# Patient Record
Sex: Male | Born: 1960 | Race: Black or African American | Hispanic: No | Marital: Single | Smoking: Current every day smoker
Health system: Southern US, Community
[De-identification: ages and names within clinical notes are randomized; demographics above are authoritative.]

## PROBLEM LIST (undated history)

## (undated) DIAGNOSIS — K6389 Other specified diseases of intestine: Secondary | ICD-10-CM

## (undated) DIAGNOSIS — A159 Respiratory tuberculosis unspecified: Secondary | ICD-10-CM

## (undated) DIAGNOSIS — I10 Essential (primary) hypertension: Secondary | ICD-10-CM

---

## 2002-06-20 ENCOUNTER — Emergency Department (HOSPITAL_COMMUNITY): Admission: EM | Admit: 2002-06-20 | Discharge: 2002-06-21 | Payer: Self-pay | Admitting: Emergency Medicine

## 2007-07-22 ENCOUNTER — Emergency Department (HOSPITAL_COMMUNITY): Admission: EM | Admit: 2007-07-22 | Discharge: 2007-07-22 | Payer: Self-pay | Admitting: Emergency Medicine

## 2008-09-28 ENCOUNTER — Emergency Department (HOSPITAL_COMMUNITY): Admission: EM | Admit: 2008-09-28 | Discharge: 2008-09-28 | Payer: Self-pay | Admitting: Emergency Medicine

## 2019-07-13 ENCOUNTER — Encounter (HOSPITAL_BASED_OUTPATIENT_CLINIC_OR_DEPARTMENT_OTHER): Payer: Self-pay

## 2019-07-13 ENCOUNTER — Other Ambulatory Visit: Payer: Self-pay

## 2019-07-13 ENCOUNTER — Inpatient Hospital Stay (HOSPITAL_BASED_OUTPATIENT_CLINIC_OR_DEPARTMENT_OTHER): Payer: BC Managed Care – PPO

## 2019-07-13 ENCOUNTER — Emergency Department (HOSPITAL_BASED_OUTPATIENT_CLINIC_OR_DEPARTMENT_OTHER): Payer: BC Managed Care – PPO

## 2019-07-13 ENCOUNTER — Inpatient Hospital Stay (HOSPITAL_BASED_OUTPATIENT_CLINIC_OR_DEPARTMENT_OTHER)
Admission: EM | Admit: 2019-07-13 | Discharge: 2019-07-23 | DRG: 330 | Disposition: A | Discharge status: Home / Self Care | Payer: BC Managed Care – PPO | Attending: Internal Medicine | Admitting: Internal Medicine

## 2019-07-13 DIAGNOSIS — Z8611 Personal history of tuberculosis: Secondary | ICD-10-CM | POA: Diagnosis not present

## 2019-07-13 DIAGNOSIS — I10 Essential (primary) hypertension: Secondary | ICD-10-CM | POA: Diagnosis present

## 2019-07-13 DIAGNOSIS — K56609 Unspecified intestinal obstruction, unspecified as to partial versus complete obstruction: Secondary | ICD-10-CM

## 2019-07-13 DIAGNOSIS — Z20828 Contact with and (suspected) exposure to other viral communicable diseases: Secondary | ICD-10-CM | POA: Diagnosis present

## 2019-07-13 DIAGNOSIS — E876 Hypokalemia: Secondary | ICD-10-CM | POA: Diagnosis present

## 2019-07-13 DIAGNOSIS — R634 Abnormal weight loss: Secondary | ICD-10-CM | POA: Diagnosis present

## 2019-07-13 DIAGNOSIS — Z01810 Encounter for preprocedural cardiovascular examination: Secondary | ICD-10-CM

## 2019-07-13 DIAGNOSIS — C184 Malignant neoplasm of transverse colon: Secondary | ICD-10-CM | POA: Diagnosis present

## 2019-07-13 DIAGNOSIS — K6389 Other specified diseases of intestine: Secondary | ICD-10-CM

## 2019-07-13 DIAGNOSIS — Z716 Tobacco abuse counseling: Secondary | ICD-10-CM | POA: Diagnosis not present

## 2019-07-13 DIAGNOSIS — F17210 Nicotine dependence, cigarettes, uncomplicated: Secondary | ICD-10-CM | POA: Diagnosis present

## 2019-07-13 DIAGNOSIS — R17 Unspecified jaundice: Secondary | ICD-10-CM | POA: Diagnosis present

## 2019-07-13 DIAGNOSIS — Z8249 Family history of ischemic heart disease and other diseases of the circulatory system: Secondary | ICD-10-CM | POA: Diagnosis not present

## 2019-07-13 HISTORY — DX: Essential (primary) hypertension: I10

## 2019-07-13 HISTORY — DX: Other specified diseases of intestine: K63.89

## 2019-07-13 HISTORY — DX: Respiratory tuberculosis unspecified: A15.9

## 2019-07-13 LAB — COMPREHENSIVE METABOLIC PANEL
ALT: 24 U/L (ref 0–44)
AST: 27 U/L (ref 15–41)
Albumin: 4.6 g/dL (ref 3.5–5.0)
Alkaline Phosphatase: 83 U/L (ref 38–126)
Anion gap: 13 (ref 5–15)
BUN: 9 mg/dL (ref 6–20)
CO2: 24 mmol/L (ref 22–32)
Calcium: 9.6 mg/dL (ref 8.9–10.3)
Chloride: 101 mmol/L (ref 98–111)
Creatinine, Ser: 0.98 mg/dL (ref 0.61–1.24)
GFR calc Af Amer: >60 mL/min (ref >60)
GFR calc non Af Amer: >60 mL/min (ref >60)
Glucose, Bld: 111 mg/dL — ABNORMAL HIGH (ref 70–99)
Potassium: 3.3 mmol/L — ABNORMAL LOW (ref 3.5–5.1)
Sodium: 138 mmol/L (ref 135–145)
Total Bilirubin: 2.1 mg/dL — ABNORMAL HIGH (ref 0.3–1.2)
Total Protein: 8.4 g/dL — ABNORMAL HIGH (ref 6.5–8.1)

## 2019-07-13 LAB — URINALYSIS, MICROSCOPIC (REFLEX)

## 2019-07-13 LAB — CBC
HCT: 48.9 % (ref 39.0–52.0)
Hemoglobin: 16.5 g/dL (ref 13.0–17.0)
MCH: 31 pg (ref 26.0–34.0)
MCHC: 33.7 g/dL (ref 30.0–36.0)
MCV: 91.7 fL (ref 80.0–100.0)
Platelets: 252 10*3/uL (ref 150–400)
RBC: 5.33 MIL/uL (ref 4.22–5.81)
RDW: 14.3 % (ref 11.5–15.5)
WBC: 9.2 10*3/uL (ref 4.0–10.5)
nRBC: 0 % (ref 0.0–0.2)

## 2019-07-13 LAB — URINALYSIS, ROUTINE W REFLEX MICROSCOPIC
Glucose, UA: NEGATIVE mg/dL
Ketones, ur: 15 mg/dL — AB
Leukocytes,Ua: NEGATIVE
Nitrite: NEGATIVE
Protein, ur: 30 mg/dL — AB
Specific Gravity, Urine: >1.03 — ABNORMAL HIGH (ref 1.005–1.030)
pH: 6 (ref 5.0–8.0)

## 2019-07-13 LAB — MAGNESIUM: Magnesium: 2.1 mg/dL (ref 1.7–2.4)

## 2019-07-13 LAB — LIPASE, BLOOD: Lipase: 23 U/L (ref 11–51)

## 2019-07-13 LAB — PHOSPHORUS: Phosphorus: 3.5 mg/dL (ref 2.5–4.6)

## 2019-07-13 LAB — PROTIME-INR
INR: 1.1 (ref 0.8–1.2)
Prothrombin Time: 14.3 seconds (ref 11.4–15.2)

## 2019-07-13 LAB — SARS CORONAVIRUS 2 BY RT PCR (HOSPITAL ORDER, PERFORMED IN ~~LOC~~ HOSPITAL LAB): SARS Coronavirus 2: NEGATIVE

## 2019-07-13 MED ORDER — POTASSIUM CHLORIDE CRYS ER 20 MEQ PO TBCR
20.0000 meq | EXTENDED_RELEASE_TABLET | Freq: Once | ORAL | Status: AC
  Administered 2019-07-13: 20 meq via ORAL
  Filled 2019-07-13: qty 1

## 2019-07-13 MED ORDER — ACETAMINOPHEN 325 MG PO TABS
650.0000 mg | ORAL_TABLET | Freq: Four times a day (QID) | ORAL | Status: DC | PRN
  Administered 2019-07-19: 21:00:00 650 mg via ORAL
  Filled 2019-07-13 (×3): qty 2

## 2019-07-13 MED ORDER — SODIUM CHLORIDE 0.9% FLUSH
3.0000 mL | Freq: Once | INTRAVENOUS | Status: DC
  Filled 2019-07-13: qty 3

## 2019-07-13 MED ORDER — POTASSIUM CHLORIDE IN NACL 40-0.9 MEQ/L-% IV SOLN
INTRAVENOUS | Status: DC
  Administered 2019-07-13 – 2019-07-14 (×2): 100 mL/h via INTRAVENOUS
  Filled 2019-07-13 (×4): qty 1000

## 2019-07-13 MED ORDER — MORPHINE SULFATE (PF) 4 MG/ML IV SOLN
4.0000 mg | Freq: Once | INTRAVENOUS | Status: AC
  Administered 2019-07-13: 4 mg via INTRAVENOUS
  Filled 2019-07-13: qty 1

## 2019-07-13 MED ORDER — NICOTINE 21 MG/24HR TD PT24: 21.0000 mg | MEDICATED_PATCH | Freq: Every day | TRANSDERMAL | Status: DC | PRN

## 2019-07-13 MED ORDER — HYDROMORPHONE HCL 1 MG/ML IJ SOLN
1.0000 mg | Freq: Once | INTRAMUSCULAR | Status: AC
  Administered 2019-07-13: 1 mg via INTRAVENOUS
  Filled 2019-07-13 (×2): qty 1

## 2019-07-13 MED ORDER — IOHEXOL 300 MG/ML  SOLN
100.0000 mL | Freq: Once | INTRAMUSCULAR | Status: AC | PRN
  Administered 2019-07-13: 100 mL via INTRAVENOUS

## 2019-07-13 MED ORDER — PROCHLORPERAZINE EDISYLATE 10 MG/2ML IJ SOLN
5.0000 mg | INTRAMUSCULAR | Status: DC | PRN
  Administered 2019-07-13 – 2019-07-21 (×2): 5 mg via INTRAVENOUS
  Filled 2019-07-13 (×2): qty 2

## 2019-07-13 MED ORDER — SODIUM CHLORIDE 0.9 % IV BOLUS
1000.0000 mL | Freq: Once | INTRAVENOUS | Status: AC
  Administered 2019-07-13: 1000 mL via INTRAVENOUS

## 2019-07-13 MED ORDER — ACETAMINOPHEN 650 MG RE SUPP: 650.0000 mg | Freq: Four times a day (QID) | RECTAL | Status: DC | PRN

## 2019-07-13 NOTE — ED Provider Notes (Signed)
[facility] EMERGENCY DEPARTMENT Provider Note   CSN: [id number] Arrival date & time: 07/13/19  1600     History   Chief Complaint Chief Complaint  Patient presents with  . Abdominal Pain    HPI Billy Yates is a 58 y.o. male.  He is presenting with 2 days of diffuse abdominal pain.  He is also been more constipated over a week although he is tried a laxative and had a little bit of a bowel movement today.  He is nauseous and is vomited once.  No fevers but he is feeling chilled at times.  No cough no shortness of breath no sore throat no headache.  No urinary symptoms.     The history is provided by the patient.  Abdominal Pain Pain location:  Generalized Pain quality: aching   Pain radiates to:  Does not radiate Pain severity:  Moderate Onset quality:  Gradual Duration:  2 days Timing:  Constant Progression:  Worsening Chronicity:  New Context: laxative use   Context: not recent travel, not sick contacts, not suspicious food intake and not trauma   Relieved by:  Nothing Worsened by:  Nothing Ineffective treatments:  None tried Associated symptoms: chills, constipation, nausea and vomiting   Associated symptoms: no chest pain, no cough, no diarrhea, no dysuria, no fever, no hematemesis, no hematochezia, no hematuria, no melena, no shortness of breath and no sore throat     Past Medical History:  Diagnosis Date  . Tuberculosis     There are no active problems to display for this patient.   Past Surgical History:  Procedure Laterality Date  . ACHILLES TENDON REPAIR          Home Medications    Prior to Admission medications   Not on File    Family History No family history on file.  Social History Social History   Tobacco Use  . Smoking status: Current Every Day Smoker    Types: Cigarettes  . Smokeless tobacco: Never Used  Substance Use Topics  . Alcohol use: Never    Frequency: Never  . Drug use: Never     Allergies    Patient has no known allergies.   Review of Systems Review of Systems  Constitutional: Positive for chills. Negative for fever.  HENT: Negative for sore throat.   Eyes: Negative for visual disturbance.  Respiratory: Negative for cough and shortness of breath.   Cardiovascular: Negative for chest pain.  Gastrointestinal: Positive for abdominal distention, abdominal pain, constipation, nausea and vomiting. Negative for blood in stool, diarrhea, hematemesis, hematochezia and melena.  Genitourinary: Negative for dysuria and hematuria.  Musculoskeletal: Negative for neck pain.  Skin: Negative for rash.  Neurological: Negative for headaches.     Physical Exam Updated Vital Signs BP (!) 167/107 (BP Location: Left Arm)   Pulse 77   Temp 98.2 F (36.8 C) (Oral)   Resp 20   Ht 6' (1.829 m)   Wt 78.5 kg   SpO2 100%   BMI 23.46 kg/m   Physical Exam Vitals signs and nursing note reviewed.  Constitutional:      Appearance: He is well-developed.  HENT:     Head: Normocephalic and atraumatic.  Eyes:     Conjunctiva/sclera: Conjunctivae normal.  Neck:     Musculoskeletal: Neck supple.  Cardiovascular:     Rate and Rhythm: Normal rate and regular rhythm.     Heart sounds: No murmur.  Pulmonary:     Effort: Pulmonary effort is  normal. No respiratory distress.     Breath sounds: Normal breath sounds.  Abdominal:     General: Abdomen is flat. Bowel sounds are normal.     Palpations: Abdomen is soft.     Tenderness: There is no abdominal tenderness. There is no guarding or rebound.  Musculoskeletal: Normal range of motion.     Right lower leg: No edema.     Left lower leg: No edema.  Skin:    General: Skin is warm and dry.     Capillary Refill: Capillary refill takes less than 2 seconds.  Neurological:     General: No focal deficit present.     Mental Status: He is alert and oriented to person, place, and time.     Gait: Gait normal.      ED Treatments / Results  Labs  (all labs ordered are listed, but only abnormal results are displayed) Labs Reviewed  COMPREHENSIVE METABOLIC PANEL - Abnormal; Notable for the following components:      Result Value   Potassium 3.3 (*)    Glucose, Bld 111 (*)    Total Protein 8.4 (*)    Total Bilirubin 2.1 (*)    All other components within normal limits  URINALYSIS, ROUTINE W REFLEX MICROSCOPIC - Abnormal; Notable for the following components:   Color, Urine AMBER (*)    Specific Gravity, Urine >1.030 (*)    Hgb urine dipstick MODERATE (*)    Bilirubin Urine MODERATE (*)    Ketones, ur 15 (*)    Protein, ur 30 (*)    All other components within normal limits  URINALYSIS, MICROSCOPIC (REFLEX) - Abnormal; Notable for the following components:   Bacteria, UA RARE (*)    All other components within normal limits  LIPASE, BLOOD  CBC    EKG None  Radiology X-ray Chest Pa And Lateral  Result Date: 07/13/2019 CLINICAL DATA:  Preop. EXAM: CHEST - 2 VIEW COMPARISON:  Lung bases from abdominal CT earlier this day. FINDINGS: The cardiomediastinal contours are normal. Trace right pleural effusion on CT not well visualized. Mild right apically pleuroparenchymal scarring. Pulmonary vasculature is normal. No consolidation or pneumothorax. No acute osseous abnormalities are seen. IMPRESSION: Trace right pleural effusion on CT not well visualized radiographically. Mild right apical pleuroparenchymal scarring. Electronically Signed   By: Keith Rake M.D.   On: 07/13/2019 20:48   Ct Abdomen Pelvis W Contrast  Result Date: 07/13/2019 CLINICAL DATA:  Increasing abdominal pain after eating with constipation, nausea and vomiting for 1 week. 10 pound weight loss in the past month. EXAM: CT ABDOMEN AND PELVIS WITH CONTRAST TECHNIQUE: Multidetector CT imaging of the abdomen and pelvis was performed using the standard protocol following bolus administration of intravenous contrast. CONTRAST:  100 mL OMNIPAQUE IOHEXOL 300 MG/ML  SOLN  COMPARISON:  None. FINDINGS: Lower chest: Lung bases are clear. No pleural or pericardial effusion. Hepatobiliary: No focal liver abnormality is seen. No gallstones, gallbladder wall thickening, or biliary dilatation. Pancreas: Unremarkable. No pancreatic ductal dilatation or surrounding inflammatory changes. Spleen: Normal in size without focal abnormality. Adrenals/Urinary Tract: Adrenal glands are unremarkable. Kidneys are normal, without renal calculi, focal lesion, or hydronephrosis. Bladder is unremarkable. Stomach/Bowel: The patient has a stricturing mass lesion with an "apple-core" configuration just proximal to the splenic flexure of the colon as seen on image 12 of series 2. The lesion results in obstruction of the ascending and transverse colon and small bowel. Mild distention of the stomach is also seen. No CT signs  of bowel ischemia. The appendix appears normal. Vascular/Lymphatic: No significant vascular findings are present. No enlarged abdominal or pelvic lymph nodes. Reproductive: Mild prostatomegaly. Other: None. Musculoskeletal: No lytic or sclerotic lesion. Multilevel lumbar facet degenerative change is noted. IMPRESSION: Mass lesion in the colon with an apple-core configuration just proximal to the splenic flexure is most consistent with carcinoma. The lesion results in obstruction of the ascending and transverse colon and small bowel. No metastatic disease identified. Mild prostatomegaly. These results were called by telephone at the time of interpretation on 07/13/2019 at 7:15 pm to Dr. Aletta Edouard , who verbally acknowledged these results. Electronically Signed   By: Inge Rise M.D.   On: 07/13/2019 19:18    Procedures Procedures (including critical care time)  Medications Ordered in ED Medications  sodium chloride flush (NS) 0.9 % injection 3 mL (3 mLs Intravenous Not Given 07/13/19 1739)  0.9 % NaCl with KCl 40 mEq / L  infusion (100 mL/hr Intravenous New Bag/Given 07/14/19  0903)  nicotine (NICODERM CQ - dosed in mg/24 hours) patch 21 mg (has no administration in time range)  acetaminophen (TYLENOL) tablet 650 mg (has no administration in time range)    Or  acetaminophen (TYLENOL) suppository 650 mg (has no administration in time range)  prochlorperazine (COMPAZINE) injection 5 mg (5 mg Intravenous Given 07/13/19 2048)  HYDROmorphone (DILAUDID) injection 1 mg (1 mg Intravenous Given 07/14/19 0520)  sodium chloride 0.9 % bolus 1,000 mL ( Intravenous Stopped 07/13/19 1930)  potassium chloride SA (K-DUR) CR tablet 20 mEq (20 mEq Oral Given 07/13/19 1801)  iohexol (OMNIPAQUE) 300 MG/ML solution 100 mL (100 mLs Intravenous Contrast Given 07/13/19 1856)  morphine 4 MG/ML injection 4 mg (4 mg Intravenous Given 07/13/19 1948)  HYDROmorphone (DILAUDID) injection 1 mg (1 mg Intravenous Given 07/13/19 2141)     Initial Impression / Assessment and Plan / ED Course  I have reviewed the triage vital signs and the nursing notes.  Pertinent labs & imaging results that were available during my care of the patient were reviewed by me and considered in my medical decision making (see chart for details).  Clinical Course as of Jul 14 911  Tue Jul 12, 9093  12 58 year old male here with diffuse abdominal pain and constipation.  He is hypertensive here otherwise with normal vital signs and a very benign exam.  His lab work shows normal white count and normal hemoglobin.  LFTs are normal other than a bili of 2.1.  Potassium mildly low at 3.3.  His urine looks concentrated with 6-10 reds.  We will give some IV fluids potassium orally and put him in for CT abdomen and pelvis.   [MB]  1750 Differential diagnosis includes diverticulitis, ureterolithiasis, constipation, AGE.    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  Awaiting final report to review with patient.    [MB]  1937 I reviewed the results with the patient.  He said he is never had a colonoscopy before and there is no family history of any colon cancer.  He does smoke but does not drink or use any drugs.  He is agreeable for me contacting general surgery for consult and would accept transfer if needed.  He is asking for something for pain and have ordered some morphine.  I have also ordered a Covid test.   [MB]  2008 Discussed with Dr. Lucia Gaskins from  [facility] who recommends that the patient be admitted to the medical service and he anticipates doing an operation in the next day or 2.  I discussed with Dr. Olevia Bowens tried hospitalist at [facility] who is accepting the patient in transfer for admission.   [MB]    Clinical Course User Index [MB] Hayden Rasmussen, MD        Final Clinical Impressions(s) / ED Diagnoses   Final diagnoses:  Colonic mass    ED Discharge Orders    None       Hayden Rasmussen, MD 07/14/19 [phone]

## 2019-07-13 NOTE — H&P (Signed)
History and Physical    Billy Yates HFW:[id number] DOB: 05-20-1961 DOA: 07/13/2019  PCP: Patient, No Pcp Per   Patient coming from: Home.  I have personally briefly reviewed patient's old medical records in [place]  Chief Complaint: Abdominal pain.  HPI: Billy Yates is a 58 y.o. male with medical history significant of active tuberculosis treated for 9 months who is coming to the emergency department due to progressively worse abdominal pains for the last 2 months, but particularly worse in the last week associated with constipation with occasional small-volume loose stool bowel movement and a 10 pound weight loss.  He has been nauseous, but denies emesis.  No melena or hematochezia.  Denies dysuria, frequency or hematuria.  He denies fever, chills, sore throat or rhinorrhea.  No chest pain, palpitations, dizziness, PND, orthopnea or pitting edema of the lower extremities.  No polyuria, polydipsia, polyphagia or blurred vision.  ED Course: Initial vital signs temperature 98.2 F, pulse 77, respiration 20, blood pressure 167/107 mmHg and O2 sat of 100% on room air.  The patient received a 1000 mL NS bolus, 20 mEq of KCl p.o. and 4 mg of morphine sulfate.  His urinalysis showed increase in a specific gravity, moderate hemoglobinuria and bilirubin.  There was ketonuria 15 and proteinuria 30 mg/dL.  CBC was normal.  CMP shows a potassium 3.3 mmol/L.  Glucose 111 and total bilirubin 2.1 mg/dL and total protein 8.4 g/dL.  Lipase level was normal.  CT abdomen shows proximal the splenic flexure colon mass.  Please see images and full radiology report for further detail.  Review of Systems: As per HPI otherwise 10 point review of systems negative.   Past Medical History:  Diagnosis Date   Tuberculosis     Past Surgical History:  Procedure Laterality Date   ACHILLES TENDON REPAIR       reports that he has been smoking cigarettes. He has never used smokeless tobacco. He reports  that he does not drink alcohol or use drugs.  No Known Allergies  Family medical history  Mother hypertension.  Prior to Admission medications   Not on File    Physical Exam: Vitals:   07/13/19 1616 07/13/19 1855 07/13/19 2203 07/13/19 2233  BP: (!) 167/107 (!) 153/86 (!) 147/89 (!) 156/89  Pulse: 77 71 78 65  Resp: 20 16 20 18   Temp: 98.2 F (36.8 C)  98.8 F (37.1 C) 98.2 F (36.8 C)  TempSrc: Oral  Oral Oral  SpO2: 100% 100% 96% 98%  Weight:      Height:        Constitutional: NAD, calm, comfortable Eyes: PERRL, lids and conjunctivae normal ENMT: Mucous membranes are moist. Posterior pharynx clear of any exudate or lesions.  Neck: normal, supple, no masses, no thyromegaly Respiratory: clear to auscultation bilaterally, no wheezing, no crackles. Normal respiratory effort. No accessory muscle use.  Cardiovascular: Regular rate and rhythm, no murmurs / rubs / gallops. No extremity edema. 2+ pedal pulses. No carotid bruits.  Abdomen: Nondistended. Bowel sounds positive.   Soft, positive LUQ tenderness, no guarding or rebound, no masses palpated. No hepatosplenomegaly. Musculoskeletal: no clubbing / cyanosis.Good ROM, no contractures. Normal muscle tone.  Skin: no rashes, lesions, ulcers. No induration Neurologic: CN 2-12 grossly intact. Sensation intact, DTR normal. Strength 5/5 in all 4.  Psychiatric: Normal judgment and insight. Alert and oriented x 3. Normal mood.   Labs on Admission: I have personally reviewed following labs and imaging studies  CBC: Recent  Labs  Lab 07/13/19 1644  WBC 9.2  HGB 16.5  HCT 48.9  MCV 91.7  PLT 938   Basic Metabolic Panel: Recent Labs  Lab 07/13/19 1644  NA 138  K 3.3*  CL 101  CO2 24  GLUCOSE 111*  BUN 9  CREATININE 0.98  CALCIUM 9.6  MG 2.1  PHOS 3.5   GFR: Estimated Creatinine Clearance: 92.4 mL/min (by C-G formula based on SCr of 0.98 mg/dL). Liver Function Tests: Recent Labs  Lab 07/13/19 1644  AST 27  ALT  24  ALKPHOS 83  BILITOT 2.1*  PROT 8.4*  ALBUMIN 4.6   Recent Labs  Lab 07/13/19 1644  LIPASE 23   No results for input(s): AMMONIA in the last 168 hours. Coagulation Profile: Recent Labs  Lab 07/13/19 2200  INR 1.1   Cardiac Enzymes: No results for input(s): CKTOTAL, CKMB, CKMBINDEX, TROPONINI in the last 168 hours. BNP (last 3 results) No results for input(s): PROBNP in the last 8760 hours. HbA1C: No results for input(s): HGBA1C in the last 72 hours. CBG: No results for input(s): GLUCAP in the last 168 hours. Lipid Profile: No results for input(s): CHOL, HDL, LDLCALC, TRIG, CHOLHDL, LDLDIRECT in the last 72 hours. Thyroid Function Tests: No results for input(s): TSH, T4TOTAL, FREET4, T3FREE, THYROIDAB in the last 72 hours. Anemia Panel: No results for input(s): VITAMINB12, FOLATE, FERRITIN, TIBC, IRON, RETICCTPCT in the last 72 hours. Urine analysis:    Component Value Date/Time   COLORURINE AMBER (A) 07/13/2019 1644   APPEARANCEUR CLEAR 07/13/2019 1644   LABSPEC >1.030 (H) 07/13/2019 1644   PHURINE 6.0 07/13/2019 1644   GLUCOSEU NEGATIVE 07/13/2019 1644   HGBUR MODERATE (A) 07/13/2019 1644   BILIRUBINUR MODERATE (A) 07/13/2019 1644   KETONESUR 15 (A) 07/13/2019 1644   PROTEINUR 30 (A) 07/13/2019 1644   NITRITE NEGATIVE 07/13/2019 1644   LEUKOCYTESUR NEGATIVE 07/13/2019 1644    Radiological Exams on Admission: X-ray Chest Pa And Lateral  Result Date: 07/13/2019 CLINICAL DATA:  Preop. EXAM: CHEST - 2 VIEW COMPARISON:  Lung bases from abdominal CT earlier this day. FINDINGS: The cardiomediastinal contours are normal. Trace right pleural effusion on CT not well visualized. Mild right apically pleuroparenchymal scarring. Pulmonary vasculature is normal. No consolidation or pneumothorax. No acute osseous abnormalities are seen. IMPRESSION: Trace right pleural effusion on CT not well visualized radiographically. Mild right apical pleuroparenchymal scarring.  Electronically Signed   By: Keith Rake M.D.   On: 07/13/2019 20:48   Ct Abdomen Pelvis W Contrast  Result Date: 07/13/2019 CLINICAL DATA:  Increasing abdominal pain after eating with constipation, nausea and vomiting for 1 week. 10 pound weight loss in the past month. EXAM: CT ABDOMEN AND PELVIS WITH CONTRAST TECHNIQUE: Multidetector CT imaging of the abdomen and pelvis was performed using the standard protocol following bolus administration of intravenous contrast. CONTRAST:  100 mL OMNIPAQUE IOHEXOL 300 MG/ML  SOLN COMPARISON:  None. FINDINGS: Lower chest: Lung bases are clear. No pleural or pericardial effusion. Hepatobiliary: No focal liver abnormality is seen. No gallstones, gallbladder wall thickening, or biliary dilatation. Pancreas: Unremarkable. No pancreatic ductal dilatation or surrounding inflammatory changes. Spleen: Normal in size without focal abnormality. Adrenals/Urinary Tract: Adrenal glands are unremarkable. Kidneys are normal, without renal calculi, focal lesion, or hydronephrosis. Bladder is unremarkable. Stomach/Bowel: The patient has a stricturing mass lesion with an "apple-core" configuration just proximal to the splenic flexure of the colon as seen on image 12 of series 2. The lesion results in obstruction of the ascending  and transverse colon and small bowel. Mild distention of the stomach is also seen. No CT signs of bowel ischemia. The appendix appears normal. Vascular/Lymphatic: No significant vascular findings are present. No enlarged abdominal or pelvic lymph nodes. Reproductive: Mild prostatomegaly. Other: None. Musculoskeletal: No lytic or sclerotic lesion. Multilevel lumbar facet degenerative change is noted. IMPRESSION: Mass lesion in the colon with an apple-core configuration just proximal to the splenic flexure is most consistent with carcinoma. The lesion results in obstruction of the ascending and transverse colon and small bowel. No metastatic disease identified.  Mild prostatomegaly. These results were called by telephone at the time of interpretation on 07/13/2019 at 7:15 pm to Dr. Aletta Edouard , who verbally acknowledged these results. Electronically Signed   By: Inge Rise M.D.   On: 07/13/2019 19:18    EKG: Independently reviewed.  Pending.  Assessment/Plan Principal Problem:   Mass of hepatic flexure of colon Admit to MedSurg/inpatient. Keep n.p.o. Continue IV fluids. Analgesics as needed. Antiemetics as needed. General surgery will evaluate in the morning.  Active Problems:   Hypokalemia Replacing. Follow-up potassium level in the morning.    Hyperbilirubinemia Repeat bilirubin level in a.m.   DVT prophylaxis: Lovenox SQ. Code Status: Full code. Family Communication: Disposition Plan: Admit for surgical evaluation and possible surgical intervention. Consults called: Dr. Lucia Bitter discussed the case with Dr. Lucia Gaskins.  Surgery will evaluate in a.m. Admission status: Inpatient/MedSurg.   Reubin Milan MD [facility]  If 7PM-7AM, please contact night-coverage [email or web address]  07/13/2019, 11:20 PM   This document was prepared using Dragon voice recognition software and may contain some unintended transcription errors.

## 2019-07-13 NOTE — ED Triage Notes (Addendum)
C/o abd pain-pain worse after eating, constipation n/v x 1 week-pt also reports decreased appetite, weight loss 10 lbs in the last month-pt took laxative and castor oil yesterday with minimal results-NAD-steady gait

## 2019-07-14 ENCOUNTER — Encounter (HOSPITAL_COMMUNITY): Payer: Self-pay | Admitting: Internal Medicine

## 2019-07-14 DIAGNOSIS — K6389 Other specified diseases of intestine: Secondary | ICD-10-CM | POA: Diagnosis present

## 2019-07-14 LAB — PREALBUMIN: Prealbumin: 14.8 mg/dL — ABNORMAL LOW (ref 18–38)

## 2019-07-14 MED ORDER — FLEET ENEMA 7-19 GM/118ML RE ENEM: 1.0000 | ENEMA | Freq: Two times a day (BID) | RECTAL | Status: DC

## 2019-07-14 MED ORDER — HYDROMORPHONE HCL 1 MG/ML IJ SOLN
1.0000 mg | INTRAMUSCULAR | Status: DC | PRN
  Administered 2019-07-14 – 2019-07-19 (×21): 1 mg via INTRAVENOUS
  Filled 2019-07-14 (×21): qty 1

## 2019-07-14 MED ORDER — DEXTROSE-NACL 5-0.9 % IV SOLN
INTRAVENOUS | Status: DC
  Administered 2019-07-14 – 2019-07-19 (×8): via INTRAVENOUS

## 2019-07-14 MED ORDER — HEPARIN SODIUM (PORCINE) 5000 UNIT/ML IJ SOLN
5000.0000 [IU] | Freq: Three times a day (TID) | INTRAMUSCULAR | Status: DC
  Administered 2019-07-14 – 2019-07-19 (×14): 5000 [IU] via SUBCUTANEOUS
  Filled 2019-07-14 (×13): qty 1

## 2019-07-14 MED ORDER — HYDRALAZINE HCL 20 MG/ML IJ SOLN: 10.0000 mg | Freq: Four times a day (QID) | INTRAMUSCULAR | Status: DC | PRN

## 2019-07-14 MED ORDER — FLEET ENEMA 7-19 GM/118ML RE ENEM
1.0000 | ENEMA | RECTAL | Status: AC
  Administered 2019-07-14 (×2): 1 via RECTAL
  Filled 2019-07-14 (×2): qty 1

## 2019-07-14 MED ORDER — FLEET ENEMA 7-19 GM/118ML RE ENEM
1.0000 | ENEMA | RECTAL | Status: AC
  Administered 2019-07-15 (×2): 1 via RECTAL
  Filled 2019-07-14 (×2): qty 1

## 2019-07-14 NOTE — Progress Notes (Signed)
Billy Yates Demographics:    Billy Yates, is a 58 y.o. male, DOB - 1961/11/23, EGB:[id number]  Admit date - 07/13/2019   Admitting Physician Reubin Milan, MD  Outpatient Primary MD for the Billy Yates is Billy Yates, No Pcp Per  LOS - 1   Chief Complaint  Billy Yates presents with  . Abdominal Pain        Subjective:    [name] today has no fevers, no emesis,  No chest pain, abdominal pain and constipation concerns persist --Wife at bedside, questions answered  Assessment  & Plan :    Active Problems:   Hypokalemia   Hyperbilirubinemia   Colonic mass  Brief summary -58 year old male with past medical history relevant for hypertension, prior history of treatment for TB and tobacco abuse admitted on 07/13/2019 with abdominal pain and concerns about colon mass   A/p 1)Abdominal pain and colon mass of the hepatic flexure--- -CT A/P demonstrates apple core lesion in distal transverse colon just before flexure with at least partial obstruction of proximal colon. Change in bowel habits with worsening abdominal pain and a mass noted in the transverse colon on CT scan-flexible sigmoidoscopy is been planned for 07/15/2019 to be done by Dr. Benson Norway.  -GI and general surgery consult appreciated -Will most likely need colectomy with ostomy  2) Hypertension---  may use IV Hydralazine 10 mg  Every 4 hours Prn for systolic blood pressure over 160 mmhg  3)Prior H/o TB--completed multidrug regiment treatment in 2017  4) Tobacco abuse--smoking cessation advised, nicotine patch is ordered  Disposition/Need for in-Hospital Stay- Billy Yates unable to be discharged at this time due to colonic mass requiring endoluminal evaluation and possibly colectomy -Needs IV fluids while n.p.o.*  Code Status : Full code  Family Communication:   NA (Billy Yates is alert, awake and coherent) Discussed with Billy Yates's wife at bedside   Disposition Plan  : home  Consults  :  Gi/Gen Surgery  DVT Prophylaxis  :    - Heparin - SCDs   Lab Results  Component Value Date   PLT 252 07/13/2019    Inpatient Medications  Scheduled Meds: . heparin injection (subcutaneous)  5,000 Units Subcutaneous Q8H  . sodium chloride flush  3 mL Intravenous Once  . sodium phosphate  1 enema Rectal Q2H  . [START ON 07/15/2019] sodium phosphate  1 enema Rectal Q2H   Continuous Infusions: . 0.9 % NaCl with KCl 40 mEq / L 100 mL/hr (07/14/19 0903)   PRN Meds:.acetaminophen **OR** acetaminophen, HYDROmorphone (DILAUDID) injection, nicotine, prochlorperazine    Anti-infectives (From admission, onward)   None        Objective:   Vitals:   07/13/19 2233 07/14/19 0525 07/14/19 1324 07/14/19 1337  BP: (!) 156/89 (!) 163/95 (!) 166/91 (!) 152/82  Pulse: 65 77 (!) 56   Resp: 18 15    Temp: 98.2 F (36.8 C) 98 F (36.7 C) 98.8 F (37.1 C)   TempSrc: Oral Oral Oral   SpO2: 98% 95% 100%   Weight:      Height:        Wt Readings from Last 3 Encounters:  07/13/19 78.5 kg     Intake/Output Summary (Last 24 hours) at 07/14/2019 1750 Last data filed at 07/14/2019 1742 Gross per  24 hour  Intake 2928.48 ml  Output 200 ml  Net 2728.48 ml     Physical Exam  Gen:- Awake Alert,  In no apparent distress  HEENT:- [place].AT, No sclera icterus Neck-Supple Neck,No JVD,.  Lungs-  CTAB , fair symmetrical air movement CV- S1, S2 normal, regular  Abd-  +ve B.Sounds, Abd Soft, No tenderness,    Extremity/Skin:- No  edema, pedal pulses present  Psych-affect is appropriate, oriented x3 Neuro-no new focal deficits, no tremors   Data Review:   Micro Results Recent Results (from the past 240 hour(s))  SARS Coronavirus 2 [facility] order, Performed in [facility] hospital lab) Nasopharyngeal Nasopharyngeal Swab     Status: None   Collection Time: 07/13/19  7:45 PM   Specimen: Nasopharyngeal Swab  Result Value Ref Range Status   SARS  Coronavirus 2 NEGATIVE NEGATIVE Final    Comment: (NOTE) If result is NEGATIVE SARS-CoV-2 target nucleic acids are NOT DETECTED. The SARS-CoV-2 RNA is generally detectable in upper and lower  respiratory specimens during the acute phase of infection. The lowest  concentration of SARS-CoV-2 viral copies this assay can detect is 250  copies / mL. A negative result does not preclude SARS-CoV-2 infection  and should not be used as the sole basis for treatment or other  Billy Yates management decisions.  A negative result may occur with  improper specimen collection / handling, submission of specimen other  than nasopharyngeal swab, presence of viral mutation(s) within the  areas targeted by this assay, and inadequate number of viral copies  (<250 copies / mL). A negative result must be combined with clinical  observations, Billy Yates history, and epidemiological information. If result is POSITIVE SARS-CoV-2 target nucleic acids are DETECTED. The SARS-CoV-2 RNA is generally detectable in upper and lower  respiratory specimens dur ing the acute phase of infection.  Positive  results are indicative of active infection with SARS-CoV-2.  Clinical  correlation with Billy Yates history and other diagnostic information is  necessary to determine Billy Yates infection status.  Positive results do  not rule out bacterial infection or co-infection with other viruses. If result is PRESUMPTIVE POSTIVE SARS-CoV-2 nucleic acids MAY BE PRESENT.   A presumptive positive result was obtained on the submitted specimen  and confirmed on repeat testing.  While 2019 novel coronavirus  (SARS-CoV-2) nucleic acids may be present in the submitted sample  additional confirmatory testing may be necessary for epidemiological  and / or clinical management purposes  to differentiate between  SARS-CoV-2 and other Sarbecovirus currently known to infect humans.  If clinically indicated additional testing with an alternate test   methodology [phone]) is advised. The SARS-CoV-2 RNA is generally  detectable in upper and lower respiratory sp ecimens during the acute  phase of infection. The expected result is Negative. Fact Sheet for Patients:  StrictlyIdeas.no Fact Sheet for Healthcare Providers: BankingDealers.co.za This test is not yet approved or cleared by the [place] FDA and has been authorized for detection and/or diagnosis of SARS-CoV-2 by FDA under an Emergency Use Authorization (EUA).  This EUA will remain in effect (meaning this test can be used) for the duration of the COVID-19 declaration under Section 564(b)(1) of the Act, 21 U.S.C. section 360bbb-3(b)(1), unless the authorization is terminated or revoked sooner. Performed at [facility], [address], [place], [place] [address]     Radiology Reports X-ray Chest Pa And Lateral  Result Date: 07/13/2019 CLINICAL DATA:  Preop. EXAM: CHEST - 2 VIEW COMPARISON:  Lung bases  from abdominal CT earlier this day. FINDINGS: The cardiomediastinal contours are normal. Trace right pleural effusion on CT not well visualized. Mild right apically pleuroparenchymal scarring. Pulmonary vasculature is normal. No consolidation or pneumothorax. No acute osseous abnormalities are seen. IMPRESSION: Trace right pleural effusion on CT not well visualized radiographically. Mild right apical pleuroparenchymal scarring. Electronically Signed   By: Keith Rake M.D.   On: 07/13/2019 20:48   Ct Abdomen Pelvis W Contrast  Result Date: 07/13/2019 CLINICAL DATA:  Increasing abdominal pain after eating with constipation, nausea and vomiting for 1 week. 10 pound weight loss in the past month. EXAM: CT ABDOMEN AND PELVIS WITH CONTRAST TECHNIQUE: Multidetector CT imaging of the abdomen and pelvis was performed using the standard protocol following bolus administration of intravenous contrast. CONTRAST:  100 mL OMNIPAQUE  IOHEXOL 300 MG/ML  SOLN COMPARISON:  None. FINDINGS: Lower chest: Lung bases are clear. No pleural or pericardial effusion. Hepatobiliary: No focal liver abnormality is seen. No gallstones, gallbladder wall thickening, or biliary dilatation. Pancreas: Unremarkable. No pancreatic ductal dilatation or surrounding inflammatory changes. Spleen: Normal in size without focal abnormality. Adrenals/Urinary Tract: Adrenal glands are unremarkable. Kidneys are normal, without renal calculi, focal lesion, or hydronephrosis. Bladder is unremarkable. Stomach/Bowel: The Billy Yates has a stricturing mass lesion with an "apple-core" configuration just proximal to the splenic flexure of the colon as seen on image 12 of series 2. The lesion results in obstruction of the ascending and transverse colon and small bowel. Mild distention of the stomach is also seen. No CT signs of bowel ischemia. The appendix appears normal. Vascular/Lymphatic: No significant vascular findings are present. No enlarged abdominal or pelvic lymph nodes. Reproductive: Mild prostatomegaly. Other: None. Musculoskeletal: No lytic or sclerotic lesion. Multilevel lumbar facet degenerative change is noted. IMPRESSION: Mass lesion in the colon with an apple-core configuration just proximal to the splenic flexure is most consistent with carcinoma. The lesion results in obstruction of the ascending and transverse colon and small bowel. No metastatic disease identified. Mild prostatomegaly. These results were called by telephone at the time of interpretation on 07/13/2019 at 7:15 pm to Dr. Aletta Edouard , who verbally acknowledged these results. Electronically Signed   By: Inge Rise M.D.   On: 07/13/2019 19:18    CBC Recent Labs  Lab 07/13/19 1644  WBC 9.2  HGB 16.5  HCT 48.9  PLT 252  MCV 91.7  MCH 31.0  MCHC 33.7  RDW 14.3    Chemistries  Recent Labs  Lab 07/13/19 1644  NA 138  K 3.3*  CL 101  CO2 24  GLUCOSE 111*  BUN 9  CREATININE 0.98   CALCIUM 9.6  MG 2.1  AST 27  ALT 24  ALKPHOS 83  BILITOT 2.1*  ------------------------------------------------------------------------------------------------------------------ No results for input(s): CHOL, HDL, LDLCALC, TRIG, CHOLHDL, LDLDIRECT in the last 72 hours.  No results found for: HGBA1C ------------------------------------------------------------------------------------------------------------------ No results for input(s): TSH, T4TOTAL, T3FREE, THYROIDAB in the last 72 hours.  Invalid input(s): FREET3 ------------------------------------------------------------------------------------------------------------------ No results for input(s): VITAMINB12, FOLATE, FERRITIN, TIBC, IRON, RETICCTPCT in the last 72 hours.  Coagulation profile Recent Labs  Lab 07/13/19 2200  INR 1.1   No results for input(s): DDIMER in the last 72 hours.  Cardiac Enzymes No results for input(s): CKMB, TROPONINI, MYOGLOBIN in the last 168 hours.  Invalid input(s): CK ------------------------------------------------------------------------------------------------------------------ No results found for: BNP  Roxan Hockey M.D on 07/14/2019 at 5:50 PM  Go to [email or web address] - for contact info  [facility] - Office  [phone]

## 2019-07-14 NOTE — Plan of Care (Signed)
Continue current POC 

## 2019-07-14 NOTE — Consult Note (Addendum)
Billy Yates 11/17/1961  [id number].    Requesting MD: Dr. Tennis Must Chief Complaint/Reason for Consult: splenic flexure colon mass with obstruction  HPI:  This is a 58 year old otherwise healthy black male who presents to the emergency room secondary to abdominal pain with nausea and inability to move his bowels.  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  He completed 9 months of treatment for this.  He has no residual symptoms from this.  For the last 2 months he has been having issues with his bowels.  He states that he has become more constipated.  He has had an overall decrease in his appetite over the last month secondary to his inability to move his bowels well.  He has had a 10 pound unintentional weight loss.  He has been taking laxatives which initially helped move his bowels, but recently this has not been working either.  He has noted increasing abdominal distention over the last several days along with worsening nausea and dry heaves.  He denies any blood in his stools ever.  He has never had a colonoscopy.  He presented to [place] emergency department last night due to these complaints.  He had a CT scan which revealed an apple core type lesion near his splenic flexure with a resultant large and small bowel obstruction.  This is likely malignant unless proven otherwise.  The patient has been admitted and we have been asked to evaluate him for further evaluation and recommendations.  ROS: ROS: Please see HPI, otherwise all other systems have been reviewed and are negative.  Family History  Problem Relation Age of Onset  . Stroke Father     Past Medical History:  Diagnosis Date  . Colonic mass    splenic flexure  . HTN (hypertension)    stopped taking meds in past as BP improved  . Tuberculosis     Past Surgical History:  Procedure Laterality Date  . ACHILLES  TENDON REPAIR      Social History:  reports that he has been smoking cigarettes. He has been smoking about 1.00 pack per day. He has never used smokeless tobacco. He reports that he does not drink alcohol or use drugs.  Allergies: No Known Allergies  Medications Prior to Admission  Medication Sig Dispense Refill  . cyclobenzaprine (FLEXERIL) 5 MG tablet Take 5 mg by mouth daily as needed for muscle spasms.       Physical Exam: Blood pressure (!) 163/95, pulse 77, temperature 98 F (36.7 C), temperature source Oral, resp. rate 15, height 6' (1.829 m), weight 78.5 kg, SpO2 95 %. General: pleasant, WD, WN black male who is laying in bed in NAD HEENT: head is normocephalic, atraumatic.  Sclera are noninjected.  PERRL.  Ears and nose without any masses or lesions.  Mouth is pink and moist Heart: regular, rate, and rhythm.  Normal s1,s2. No obvious murmurs, gallops, or rubs noted.  Palpable radial and pedal pulses bilaterally Lungs: CTAB, no wheezes, rhonchi, or rales noted.  Respiratory effort nonlabored Abd: soft, tender in epigastrium and right side of his abdomen with fullness and some distention noted, +BS, no masses, hernias, or organomegaly MS: all 4 extremities are symmetrical with no cyanosis, clubbing, or edema. Skin: warm and dry with no masses, lesions, or rashes Psych: A&Ox3 with an appropriate affect.   Results for orders placed or performed  during the hospital encounter of 07/13/19 (from the past 48 hour(s))  Lipase, blood     Status: None   Collection Time: 07/13/19  4:44 PM  Result Value Ref Range   Lipase 23 11 - 51 U/L    Comment: Performed at [facility], [address] [place], [place] [address]  Comprehensive metabolic panel     Status: Abnormal   Collection Time: 07/13/19  4:44 PM  Result Value Ref Range   Sodium 138 135 - 145 mmol/L   Potassium 3.3 (L) 3.5 - 5.1 mmol/L   Chloride 101 98 - 111 mmol/L   CO2 24 22 - 32 mmol/L   Glucose, Bld 111 (H)  70 - 99 mg/dL   BUN 9 6 - 20 mg/dL   Creatinine, Ser 0.98 0.61 - 1.24 mg/dL   Calcium 9.6 8.9 - 10.3 mg/dL   Total Protein 8.4 (H) 6.5 - 8.1 g/dL   Albumin 4.6 3.5 - 5.0 g/dL   AST 27 15 - 41 U/L   ALT 24 0 - 44 U/L   Alkaline Phosphatase 83 38 - 126 U/L   Total Bilirubin 2.1 (H) 0.3 - 1.2 mg/dL   GFR calc non Af Amer >60 >60 mL/min   GFR calc Af Amer >60 >60 mL/min   Anion gap 13 5 - 15    Comment: Performed at [facility], [address] [place], [place] [address]  CBC     Status: None   Collection Time: 07/13/19  4:44 PM  Result Value Ref Range   WBC 9.2 4.0 - 10.5 K/uL   RBC 5.33 4.22 - 5.81 MIL/uL   Hemoglobin 16.5 13.0 - 17.0 g/dL   HCT 48.9 39.0 - 52.0 %   MCV 91.7 80.0 - 100.0 fL   MCH 31.0 26.0 - 34.0 pg   MCHC 33.7 30.0 - 36.0 g/dL   RDW 14.3 11.5 - 15.5 %   Platelets 252 150 - 400 K/uL   nRBC 0.0 0.0 - 0.2 %    Comment: Performed at [facility], [address] [place], [place] [address]  Urinalysis, Routine w reflex microscopic     Status: Abnormal   Collection Time: 07/13/19  4:44 PM  Result Value Ref Range   Color, Urine AMBER (A) YELLOW    Comment: BIOCHEMICALS MAY BE AFFECTED BY COLOR   APPearance CLEAR CLEAR   Specific Gravity, Urine >1.030 (H) 1.005 - 1.030   pH 6.0 5.0 - 8.0   Glucose, UA NEGATIVE NEGATIVE mg/dL   Hgb urine dipstick MODERATE (A) NEGATIVE   Bilirubin Urine MODERATE (A) NEGATIVE   Ketones, ur 15 (A) NEGATIVE mg/dL   Protein, ur 30 (A) NEGATIVE mg/dL   Nitrite NEGATIVE NEGATIVE   Leukocytes,Ua NEGATIVE NEGATIVE    Comment: Performed at [facility], [address] [place], [place] [address]  Urinalysis, Microscopic (reflex)     Status: Abnormal   Collection Time: 07/13/19  4:44 PM  Result Value Ref Range   RBC / HPF 6-10 0 - 5 RBC/hpf   WBC, UA 0-5 0 - 5 WBC/hpf   Bacteria, UA RARE (A) NONE SEEN   Squamous Epithelial / LPF 0-5 0 - 5   Mucus PRESENT    Ca Oxalate Crys, UA PRESENT     Comment:  Performed at [facility], [address] [place], [place] [address]  Magnesium     Status: None  Collection Time: 07/13/19  4:44 PM  Result Value Ref Range   Magnesium 2.1 1.7 - 2.4 mg/dL    Comment: Performed at [facility], [address] [place], [place] [address]  Phosphorus     Status: None   Collection Time: 07/13/19  4:44 PM  Result Value Ref Range   Phosphorus 3.5 2.5 - 4.6 mg/dL    Comment: Performed at [facility], [address] [place], [place] [address]  SARS Coronavirus 2 [facility] order, Performed in [facility] hospital lab) Nasopharyngeal Nasopharyngeal Swab     Status: None   Collection Time: 07/13/19  7:45 PM   Specimen: Nasopharyngeal Swab  Result Value Ref Range   SARS Coronavirus 2 NEGATIVE NEGATIVE    Comment: (NOTE) If result is NEGATIVE SARS-CoV-2 target nucleic acids are NOT DETECTED. The SARS-CoV-2 RNA is generally detectable in upper and lower  respiratory specimens during the acute phase of infection. The lowest  concentration of SARS-CoV-2 viral copies this assay can detect is 250  copies / mL. A negative result does not preclude SARS-CoV-2 infection  and should not be used as the sole basis for treatment or other  patient management decisions.  A negative result may occur with  improper specimen collection / handling, submission of specimen other  than nasopharyngeal swab, presence of viral mutation(s) within the  areas targeted by this assay, and inadequate number of viral copies  (<250 copies / mL). A negative result must be combined with clinical  observations, patient history, and epidemiological information. If result is POSITIVE SARS-CoV-2 target nucleic acids are DETECTED. The SARS-CoV-2 RNA is generally detectable in upper and lower  respiratory specimens dur ing the acute phase of infection.  Positive  results are indicative of active infection with SARS-CoV-2.  Clinical  correlation with patient  history and other diagnostic information is  necessary to determine patient infection status.  Positive results do  not rule out bacterial infection or co-infection with other viruses. If result is PRESUMPTIVE POSTIVE SARS-CoV-2 nucleic acids MAY BE PRESENT.   A presumptive positive result was obtained on the submitted specimen  and confirmed on repeat testing.  While 2019 novel coronavirus  (SARS-CoV-2) nucleic acids may be present in the submitted sample  additional confirmatory testing may be necessary for epidemiological  and / or clinical management purposes  to differentiate between  SARS-CoV-2 and other Sarbecovirus currently known to infect humans.  If clinically indicated additional testing with an alternate test  methodology [id number]) is advised. The SARS-CoV-2 RNA is generally  detectable in upper and lower respiratory sp ecimens during the acute  phase of infection. The expected result is Negative. Fact Sheet for Patients:  StrictlyIdeas.no Fact Sheet for Healthcare Providers: BankingDealers.co.za This test is not yet approved or cleared by the [place] FDA and has been authorized for detection and/or diagnosis of SARS-CoV-2 by FDA under an Emergency Use Authorization (EUA).  This EUA will remain in effect (meaning this test can be used) for the duration of the COVID-19 declaration under Section 564(b)(1) of the Act, 21 U.S.C. section 360bbb-3(b)(1), unless the authorization is terminated or revoked sooner. Performed at [facility], [address] [place], [place] [address]   Protime-INR     Status: None   Collection Time: 07/13/19 10:00 PM  Result Value Ref Range   Prothrombin Time 14.3 11.4 - 15.2 seconds   INR 1.1 0.8 - 1.2    Comment: (NOTE) INR goal varies  based on device and disease states. Performed at [facility], [address] [place], [place] [address]    X-ray Chest Pa And  Lateral  Result Date: 07/13/2019 CLINICAL DATA:  Preop. EXAM: CHEST - 2 VIEW COMPARISON:  Lung bases from abdominal CT earlier this day. FINDINGS: The cardiomediastinal contours are normal. Trace right pleural effusion on CT not well visualized. Mild right apically pleuroparenchymal scarring. Pulmonary vasculature is normal. No consolidation or pneumothorax. No acute osseous abnormalities are seen. IMPRESSION: Trace right pleural effusion on CT not well visualized radiographically. Mild right apical pleuroparenchymal scarring. Electronically Signed   By: Keith Rake M.D.   On: 07/13/2019 20:48   Ct Abdomen Pelvis W Contrast  Result Date: 07/13/2019 CLINICAL DATA:  Increasing abdominal pain after eating with constipation, nausea and vomiting for 1 week. 10 pound weight loss in the past month. EXAM: CT ABDOMEN AND PELVIS WITH CONTRAST TECHNIQUE: Multidetector CT imaging of the abdomen and pelvis was performed using the standard protocol following bolus administration of intravenous contrast. CONTRAST:  100 mL OMNIPAQUE IOHEXOL 300 MG/ML  SOLN COMPARISON:  None. FINDINGS: Lower chest: Lung bases are clear. No pleural or pericardial effusion. Hepatobiliary: No focal liver abnormality is seen. No gallstones, gallbladder wall thickening, or biliary dilatation. Pancreas: Unremarkable. No pancreatic ductal dilatation or surrounding inflammatory changes. Spleen: Normal in size without focal abnormality. Adrenals/Urinary Tract: Adrenal glands are unremarkable. Kidneys are normal, without renal calculi, focal lesion, or hydronephrosis. Bladder is unremarkable. Stomach/Bowel: The patient has a stricturing mass lesion with an "apple-core" configuration just proximal to the splenic flexure of the colon as seen on image 12 of series 2. The lesion results in obstruction of the ascending and transverse colon and small bowel. Mild distention of the stomach is also seen. No CT signs of bowel ischemia. The appendix appears  normal. Vascular/Lymphatic: No significant vascular findings are present. No enlarged abdominal or pelvic lymph nodes. Reproductive: Mild prostatomegaly. Other: None. Musculoskeletal: No lytic or sclerotic lesion. Multilevel lumbar facet degenerative change is noted. IMPRESSION: Mass lesion in the colon with an apple-core configuration just proximal to the splenic flexure is most consistent with carcinoma. The lesion results in obstruction of the ascending and transverse colon and small bowel. No metastatic disease identified. Mild prostatomegaly. These results were called by telephone at the time of interpretation on 07/13/2019 at 7:15 pm to Dr. Aletta Edouard , who verbally acknowledged these results. Electronically Signed   By: Inge Rise M.D.   On: 07/13/2019 19:18      Assessment/Plan Colon mass near splenic flexure with resultant large and small bowel obstruction The patient has been found to have an apple core type lesion just proximal to a splenic flexure.  This is likely to be malignant in nature given appearance on CT scan.  We will check a CEA level.  The patient will need surgical intervention for resection given his obstructive symptoms.  Ideally we would like to perform a bowel prep on this patient; however, he has been taking laxatives for the last several days and weeks with essentially no results.  He had to take castor oil over the weekend and today finally had some liquid stool pass.  Given his distention noted on exam as well as his CT scan I would be concerned about attempting a bowel prep given the risk for perforation.  If we are unable to prep the patient, he will likely require a colostomy as opposed to a primary anastomosis.  All of this  was discussed with the patient.  He understands this and would like to proceed with the safest method.  He does state that he feels like he would not be able to do a bowel prep well given his fullness and distention.  I will discussed this  patient with Dr. Dema Severin and we will further determine timing of surgical intervention.   FEN -n.p.o. except for ice chips and sips of water, IV fluids VTE -okay for chemical prophylaxis from our standpoint ID -none currently  Henreitta Cea, [facility] Surgery 07/14/2019, 10:48 AM Pager: [phone]

## 2019-07-14 NOTE — Consult Note (Signed)
[facility] Nurse requested for preoperative stoma site marking  Discussed surgical procedure and stoma creation with patient and family.  Explained role of the [facility] nurse team.  Provided the patient with educational booklet and provided samples of pouching options.  Answered patient and family questions.   Examined patient lying, sitting, and standing in order to place the marking in the patient's visual field, away from any creases or abdominal contour issues and within the rectus muscle.    Marked for colostomy in the LLQ  _4___ cm to the left of the umbilicus and _5___ZD below the umbilicus.  Marked for ileostomy in the RLQ  _5___cm to the right of the umbilicus and  _6.3___ cm above the umbilicus.   Patient's abdomen cleansed with CHG wipes at site markings, allowed to air dry prior to marking.Covered mark with thin film transparent dressing to preserve mark until date of surgery.   [facility] Nurse team will follow up with patient after surgery for continue ostomy care and teaching.  [place] MSN, [place], [place], [place]

## 2019-07-14 NOTE — Consult Note (Signed)
UNASSIGNED PATIENT Reason for Consult: Colonic mass needs stenting. Referring Physician: Nadeen Landau @ [facility] Surgery.  Billy Yates is an 58 y.o. male.  HPI: Billy Yates is a 58 year old black male with a history of hypertension and tuberculosis in the past who presented to the emergency room with worsening abdominal pain.  He gives a two-month history of periumbilical pain and change in bowel habits.  About 2 months ago he started having worsening problems with constipation but denies having any melena or hematochezia during this time.  He is ever had about a 10 pound weight loss.  He denies having any dysphagia odynophagia reflux symptoms.  There is no known family history of colon cancer celiac sprue or IBD.  In the process of his evaluation he had a CT scan of the abdomen and pelvis that revealed apple core lesion in the distal transverse colon close to splenic flexure and therefore GI consultation was requested for possible stenting to avoid a colostomy.   Past Medical History:  Diagnosis Date  . Colonic mass    splenic flexure  . HTN (hypertension)    stopped taking meds in past as BP improved  . Tuberculosis    Past Surgical History:  Procedure Laterality Date  . ACHILLES TENDON REPAIR     Family History  Problem Relation Age of Onset  . Stroke Father    Social History:  reports that he has been smoking cigarettes. He has been smoking about 1.00 pack per day. He has never used smokeless tobacco. He reports that he does not drink alcohol or use drugs.  Allergies: No Known Allergies  Medications: I have reviewed the patient's current medications.  Results for orders placed or performed during the hospital encounter of 07/13/19 (from the past 48 hour(s))  Lipase, blood     Status: None   Collection Time: 07/13/19  4:44 PM  Result Value Ref Range   Lipase 23 11 - 51 U/L    Comment: Performed at [facility], [address] [place], [place]  [address]  Comprehensive metabolic panel     Status: Abnormal   Collection Time: 07/13/19  4:44 PM  Result Value Ref Range   Sodium 138 135 - 145 mmol/L   Potassium 3.3 (L) 3.5 - 5.1 mmol/L   Chloride 101 98 - 111 mmol/L   CO2 24 22 - 32 mmol/L   Glucose, Bld 111 (H) 70 - 99 mg/dL   BUN 9 6 - 20 mg/dL   Creatinine, Ser 0.98 0.61 - 1.24 mg/dL   Calcium 9.6 8.9 - 10.3 mg/dL   Total Protein 8.4 (H) 6.5 - 8.1 g/dL   Albumin 4.6 3.5 - 5.0 g/dL   AST 27 15 - 41 U/L   ALT 24 0 - 44 U/L   Alkaline Phosphatase 83 38 - 126 U/L   Total Bilirubin 2.1 (H) 0.3 - 1.2 mg/dL   GFR calc non Af Amer >60 >60 mL/min   GFR calc Af Amer >60 >60 mL/min   Anion gap 13 5 - 15    Comment: Performed at [facility], [address] [place], [place] [address]  CBC     Status: None   Collection Time: 07/13/19  4:44 PM  Result Value Ref Range   WBC 9.2 4.0 - 10.5 K/uL   RBC 5.33 4.22 - 5.81 MIL/uL   Hemoglobin 16.5 13.0 - 17.0 g/dL   HCT 48.9 39.0 - 52.0 %   MCV  91.7 80.0 - 100.0 fL   MCH 31.0 26.0 - 34.0 pg   MCHC 33.7 30.0 - 36.0 g/dL   RDW 14.3 11.5 - 15.5 %   Platelets 252 150 - 400 K/uL   nRBC 0.0 0.0 - 0.2 %    Comment: Performed at [facility], [address] [place], [place] [address]  Urinalysis, Routine w reflex microscopic     Status: Abnormal   Collection Time: 07/13/19  4:44 PM  Result Value Ref Range   Color, Urine AMBER (A) YELLOW    Comment: BIOCHEMICALS MAY BE AFFECTED BY COLOR   APPearance CLEAR CLEAR   Specific Gravity, Urine >1.030 (H) 1.005 - 1.030   pH 6.0 5.0 - 8.0   Glucose, UA NEGATIVE NEGATIVE mg/dL   Hgb urine dipstick MODERATE (A) NEGATIVE   Bilirubin Urine MODERATE (A) NEGATIVE   Ketones, ur 15 (A) NEGATIVE mg/dL   Protein, ur 30 (A) NEGATIVE mg/dL   Nitrite NEGATIVE NEGATIVE   Leukocytes,Ua NEGATIVE NEGATIVE    Comment: Performed at [facility], [address] [place], [place] [address]  Urinalysis, Microscopic (reflex)     Status:  Abnormal   Collection Time: 07/13/19  4:44 PM  Result Value Ref Range   RBC / HPF 6-10 0 - 5 RBC/hpf   WBC, UA 0-5 0 - 5 WBC/hpf   Bacteria, UA RARE (A) NONE SEEN   Squamous Epithelial / LPF 0-5 0 - 5   Mucus PRESENT    Ca Oxalate Crys, UA PRESENT     Comment: Performed at [facility], [address] [place], [place] [address]  Magnesium     Status: None   Collection Time: 07/13/19  4:44 PM  Result Value Ref Range   Magnesium 2.1 1.7 - 2.4 mg/dL    Comment: Performed at [facility], [address] [place], [place] [address]  Phosphorus     Status: None   Collection Time: 07/13/19  4:44 PM  Result Value Ref Range   Phosphorus 3.5 2.5 - 4.6 mg/dL    Comment: Performed at [facility], [address] [place], [place] [address]  SARS Coronavirus 2 [facility] order, Performed in [facility] hospital lab) Nasopharyngeal Nasopharyngeal Swab     Status: None   Collection Time: 07/13/19  7:45 PM   Specimen: Nasopharyngeal Swab  Result Value Ref Range   SARS Coronavirus 2 NEGATIVE NEGATIVE    Comment: (NOTE) If result is NEGATIVE SARS-CoV-2 target nucleic acids are NOT DETECTED. The SARS-CoV-2 RNA is generally detectable in upper and lower  respiratory specimens during the acute phase of infection. The lowest  concentration of SARS-CoV-2 viral copies this assay can detect is 250  copies / mL. A negative result does not preclude SARS-CoV-2 infection  and should not be used as the sole basis for treatment or other  patient management decisions.  A negative result may occur with  improper specimen collection / handling, submission of specimen other  than nasopharyngeal swab, presence of viral mutation(s) within the  areas targeted by this assay, and inadequate number of viral copies  (<250 copies / mL). A negative result must be combined with clinical  observations, patient history, and epidemiological information. If result is POSITIVE SARS-CoV-2  target nucleic acids are DETECTED. The SARS-CoV-2 RNA is generally detectable in upper and lower  respiratory specimens dur ing the acute phase of infection.  Positive  results are indicative  of active infection with SARS-CoV-2.  Clinical  correlation with patient history and other diagnostic information is  necessary to determine patient infection status.  Positive results do  not rule out bacterial infection or co-infection with other viruses. If result is PRESUMPTIVE POSTIVE SARS-CoV-2 nucleic acids MAY BE PRESENT.   A presumptive positive result was obtained on the submitted specimen  and confirmed on repeat testing.  While 2019 novel coronavirus  (SARS-CoV-2) nucleic acids may be present in the submitted sample  additional confirmatory testing may be necessary for epidemiological  and / or clinical management purposes  to differentiate between  SARS-CoV-2 and other Sarbecovirus currently known to infect humans.  If clinically indicated additional testing with an alternate test  methodology [phone]) is advised. The SARS-CoV-2 RNA is generally  detectable in upper and lower respiratory sp ecimens during the acute  phase of infection. The expected result is Negative. Fact Sheet for Patients:  StrictlyIdeas.no Fact Sheet for Healthcare Providers: BankingDealers.co.za This test is not yet approved or cleared by the [place] FDA and has been authorized for detection and/or diagnosis of SARS-CoV-2 by FDA under an Emergency Use Authorization (EUA).  This EUA will remain in effect (meaning this test can be used) for the duration of the COVID-19 declaration under Section 564(b)(1) of the Act, 21 U.S.C. section 360bbb-3(b)(1), unless the authorization is terminated or revoked sooner. Performed at [facility], [address] [place], [place] [address]   Protime-INR     Status: None   Collection Time: 07/13/19 10:00 PM   Result Value Ref Range   Prothrombin Time 14.3 11.4 - 15.2 seconds   INR 1.1 0.8 - 1.2    Comment: (NOTE) INR goal varies based on device and disease states. Performed at [facility], [address], [place], [place] [address]     X-ray Chest Pa And Lateral  Result Date: 07/13/2019 CLINICAL DATA:  Preop. EXAM: CHEST - 2 VIEW COMPARISON:  Lung bases from abdominal CT earlier this day. FINDINGS: The cardiomediastinal contours are normal. Trace right pleural effusion on CT not well visualized. Mild right apically pleuroparenchymal scarring. Pulmonary vasculature is normal. No consolidation or pneumothorax. No acute osseous abnormalities are seen. IMPRESSION: Trace right pleural effusion on CT not well visualized radiographically. Mild right apical pleuroparenchymal scarring. Electronically Signed   By: Keith Rake M.D.   On: 07/13/2019 20:48   Ct Abdomen Pelvis W Contrast  Result Date: 07/13/2019 CLINICAL DATA:  Increasing abdominal pain after eating with constipation, nausea and vomiting for 1 week. 10 pound weight loss in the past month. EXAM: CT ABDOMEN AND PELVIS WITH CONTRAST TECHNIQUE: Multidetector CT imaging of the abdomen and pelvis was performed using the standard protocol following bolus administration of intravenous contrast. CONTRAST:  100 mL OMNIPAQUE IOHEXOL 300 MG/ML  SOLN COMPARISON:  None. FINDINGS: Lower chest: Lung bases are clear. No pleural or pericardial effusion. Hepatobiliary: No focal liver abnormality is seen. No gallstones, gallbladder wall thickening, or biliary dilatation. Pancreas: Unremarkable. No pancreatic ductal dilatation or surrounding inflammatory changes. Spleen: Normal in size without focal abnormality. Adrenals/Urinary Tract: Adrenal glands are unremarkable. Kidneys are normal, without renal calculi, focal lesion, or hydronephrosis. Bladder is unremarkable. Stomach/Bowel: The patient has a stricturing mass lesion with an "apple-core"  configuration just proximal to the splenic flexure of the colon as seen on image 12 of series 2. The lesion results in obstruction of the ascending and transverse colon and small bowel. Mild distention of the stomach  is also seen. No CT signs of bowel ischemia. The appendix appears normal. Vascular/Lymphatic: No significant vascular findings are present. No enlarged abdominal or pelvic lymph nodes. Reproductive: Mild prostatomegaly. Other: None. Musculoskeletal: No lytic or sclerotic lesion. Multilevel lumbar facet degenerative change is noted. IMPRESSION: Mass lesion in the colon with an apple-core configuration just proximal to the splenic flexure is most consistent with carcinoma. The lesion results in obstruction of the ascending and transverse colon and small bowel. No metastatic disease identified. Mild prostatomegaly. These results were called by telephone at the time of interpretation on 07/13/2019 at 7:15 pm to Dr. Aletta Edouard , who verbally acknowledged these results. Electronically Signed   By: Inge Rise M.D.   On: 07/13/2019 19:18   Review of Systems  Constitutional: Positive for malaise/fatigue and weight loss. Negative for chills and fever.  HENT: Negative.   Eyes: Negative.   Respiratory: Negative.   Cardiovascular: Negative.   Gastrointestinal: Positive for abdominal pain and constipation. Negative for blood in stool, diarrhea, heartburn, melena, nausea and vomiting.  Genitourinary: Negative.   Musculoskeletal: Negative.   Skin: Negative.   Neurological: Negative.   Endo/Heme/Allergies: Negative.   Psychiatric/Behavioral: Negative.    Blood pressure (!) 152/82, pulse (!) 56, temperature 98.8 F (37.1 C), temperature source Oral, resp. rate 15, height 6' (1.829 m), weight 78.5 kg, SpO2 100 %. Physical Exam  Constitutional: He is oriented to person, place, and time. He appears well-developed and well-nourished.  HENT:  Head: Normocephalic and atraumatic.  Eyes: Pupils are  equal, round, and reactive to light. Conjunctivae and EOM are normal.  Neck: Normal range of motion. Neck supple.  Cardiovascular: Normal rate and regular rhythm.  GI: Soft. Bowel sounds are normal. He exhibits no distension and no mass. There is abdominal tenderness. There is no rebound and no guarding.  Musculoskeletal: Normal range of motion.  Neurological: He is alert and oriented to person, place, and time.  Skin: Skin is warm and dry.  Psychiatric: He has a normal mood and affect. His behavior is normal. Judgment and thought content normal.   Assessment/Plan: 1) Change in bowel habits with worsening abdominal pain and a mass noted in the transverse colon on CT scan-flexible sigmoidoscopy is been planned tomorrow to be done by Dr. Benson Norway.  2) Hypertension 3) History of tuberculosis. 4) Tobacco abuse Juanita Craver 07/14/2019, 3:02 PM

## 2019-07-15 ENCOUNTER — Encounter (HOSPITAL_COMMUNITY): Payer: Self-pay | Admitting: *Deleted

## 2019-07-15 ENCOUNTER — Inpatient Hospital Stay (HOSPITAL_COMMUNITY): Payer: BC Managed Care – PPO | Admitting: Anesthesiology

## 2019-07-15 ENCOUNTER — Inpatient Hospital Stay (HOSPITAL_COMMUNITY): Payer: BC Managed Care – PPO

## 2019-07-15 ENCOUNTER — Encounter (HOSPITAL_COMMUNITY)
Admission: EM | Disposition: A | Discharge status: Home / Self Care | Payer: Self-pay | Source: Home / Self Care | Attending: Family Medicine

## 2019-07-15 HISTORY — PX: FLEXIBLE SIGMOIDOSCOPY: SHX5431

## 2019-07-15 HISTORY — PX: COLONIC STENT PLACEMENT: SHX5542

## 2019-07-15 LAB — CEA: CEA: 17.5 ng/mL — ABNORMAL HIGH (ref 0.0–4.7)

## 2019-07-15 SURGERY — SIGMOIDOSCOPY, FLEXIBLE
Anesthesia: Monitor Anesthesia Care

## 2019-07-15 MED ORDER — LACTATED RINGERS IV SOLN
INTRAVENOUS | Status: DC
  Administered 2019-07-15: 1000 mL via INTRAVENOUS
  Administered 2019-07-19: 14:00:00 via INTRAVENOUS

## 2019-07-15 MED ORDER — PHENYLEPHRINE HCL (PRESSORS) 10 MG/ML IV SOLN
INTRAVENOUS | Status: DC | PRN
  Administered 2019-07-15: 160 ug via INTRAVENOUS

## 2019-07-15 MED ORDER — SODIUM CHLORIDE 0.9 % IV SOLN: INTRAVENOUS | Status: DC

## 2019-07-15 MED ORDER — IOHEXOL 300 MG/ML  SOLN
INTRAMUSCULAR | Status: DC | PRN
  Administered 2019-07-15: 15:00:00 25 mL

## 2019-07-15 MED ORDER — PROPOFOL 10 MG/ML IV BOLUS
INTRAVENOUS | Status: AC
  Filled 2019-07-15: qty 20

## 2019-07-15 MED ORDER — BOOST / RESOURCE BREEZE PO LIQD CUSTOM
1.0000 | Freq: Three times a day (TID) | ORAL | Status: DC
  Administered 2019-07-15 – 2019-07-18 (×7): 1 via ORAL

## 2019-07-15 MED ORDER — HYDROCORTISONE (PERIANAL) 2.5 % EX CREA
TOPICAL_CREAM | Freq: Two times a day (BID) | CUTANEOUS | Status: DC
  Administered 2019-07-15 (×2): 1 via RECTAL
  Administered 2019-07-16: 11:00:00 via RECTAL
  Filled 2019-07-15: qty 28.35

## 2019-07-15 MED ORDER — PROPOFOL 10 MG/ML IV BOLUS
INTRAVENOUS | Status: AC
  Filled 2019-07-15: qty 40

## 2019-07-15 MED ORDER — PROPOFOL 500 MG/50ML IV EMUL
INTRAVENOUS | Status: DC | PRN
  Administered 2019-07-15: 150 ug/kg/min via INTRAVENOUS

## 2019-07-15 NOTE — Anesthesia Postprocedure Evaluation (Signed)
Anesthesia Post Note  Patient: Billy Yates  Procedure(s) Performed: FLEXIBLE SIGMOIDOSCOPY (Left ) COLONIC STENT PLACEMENT (N/A )     Patient location during evaluation: Endoscopy Anesthesia Type: MAC Level of consciousness: awake and alert Pain management: pain level controlled Vital Signs Assessment: post-procedure vital signs reviewed and stable Respiratory status: spontaneous breathing, nonlabored ventilation, respiratory function stable and patient connected to nasal cannula oxygen Cardiovascular status: blood pressure returned to baseline and stable Postop Assessment: no apparent nausea or vomiting Anesthetic complications: no    Last Vitals:  Vitals:   07/15/19 1533 07/15/19 1550  BP: (!) 163/112 (!) 173/98  Pulse: 83 70  Resp: 20 18  Temp: 36.5 C   SpO2: 100% 100%    Last Pain:  Vitals:   07/15/19 1550  TempSrc:   PainSc: 0-No pain                 Natasia Sanko DANIEL

## 2019-07-15 NOTE — Op Note (Signed)
[facility] Patient Name: Billy Yates Procedure Date: 07/15/2019 MRN: [id number] Attending MD: Carol Ada , MD Date of Birth: February 05, 1961 CSN: [id number] Age: 58 Admit Type: Inpatient Procedure:                Flexible Sigmoidoscopy Indications:              Abnormal CT of the GI tract Providers:                Carol Ada, MD, Cleda Daub, RN, Cherylynn Ridges,                            Technician, Herbie Drape, CRNA Referring MD:              Medicines:                Propofol per Anesthesia Complications:            No immediate complications. Estimated Blood Loss:     Estimated blood loss: none. Procedure:                Pre-Anesthesia Assessment:                           - Prior to the procedure, a History and Physical                            was performed, and patient medications and                            allergies were reviewed. The patient's tolerance of                            previous anesthesia was also reviewed. The risks                            and benefits of the procedure and the sedation                            options and risks were discussed with the patient.                            All questions were answered, and informed consent                            was obtained. Prior Anticoagulants: The patient has                            taken heparin, last dose was day of procedure. ASA                            Grade Assessment: II - A patient with mild systemic                            disease. After reviewing the risks and benefits,  the patient was deemed in satisfactory condition to                            undergo the procedure.                           - Sedation was administered by an anesthesia                            professional. Deep sedation was attained.                           After obtaining informed consent, the scope was                            passed under direct  vision. The CF-[id number] ([id number])                            Olympus colonoscope was introduced through the anus                            and advanced to the the left transverse colon. The                            flexible sigmoidoscopy was technically difficult                            and complex. The quality of the bowel preparation                            was adequate. Scope In: Scope Out: Findings:      A fungating, infiltrative and ulcerated completely obstructing large       mass was found in the distal transverse colon. The mass was       circumferential. The mass measured five cm in length. No bleeding was       present. This was stented with a 22 mm x 9 cm WallFlex stent. Estimated       blood loss: none.      The distal colon was well prepped, but as the colonoscope was advanced       more solid and thick liquid stool was encountered. The apple core lesion       was encountered and the lumen was essentially obstructed. Using the 450       cm Jagwire, the guidewire was able to pass through the malignant       stricture. An injection catheter was then advanced over the wire and       contrast was injected with fluoroscopy. An approximatelry 5 cm stenosis       was identified. A 22 cm x 90 cm colonic Wallstent was advanced over the       wire and through the stricture under fluoroscopic guidance. After good       positioning of the stent, endoscopically and fluoroscopically, was       obtained the stent was successfully deployed. Just before deployment the       insertion of the stent catheter allowed for wicking  of proximal liquid       stool. This helped to confirm that the guidewire and stent was in the       colonic lumen proximal to the malignant stricture. Once the stent was       deployed moe liquid stool exited through the stent. An endoscopic view       of the stent lumen was obtained, but it was not able to traverse the       stricture. Fluoroscopic images were  obtained to confirm excellent stent       placement post procedure. 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 Prosthesis placed.                           - No specimens collected. Moderate Sedation:      Not Applicable - Patient had care per Anesthesia. Recommendation:           - Return patient to hospital ward for ongoing care.                           - NPO.                           - Resection per Surgery. 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 All rights reserved. The codes documented in this report are preliminary and upon coder review may  be revised to meet current compliance requirements. Carol Ada, MD Carol Ada, MD 07/15/2019 3:40:34 PM This report has been signed electronically. Number of Addenda: 0

## 2019-07-15 NOTE — Progress Notes (Signed)
Initial Nutrition Assessment  INTERVENTION:   -Diet advancement per MD -Once diet advanced, recommend Ensure Enlive po BID, each supplement provides 350 kcal and 20 grams of protein  NUTRITION DIAGNOSIS:   Inadequate oral intake related to poor appetite as evidenced by per patient/family report.  GOAL:   Patient will meet greater than or equal to 90% of their needs  MONITOR:   Diet advancement, Labs, Weight trends, I & O's  REASON FOR ASSESSMENT:   Malnutrition Screening Tool    ASSESSMENT:   58 year old male with past medical history relevant for hypertension, prior history of treatment for TB and tobacco abuse admitted on 07/13/2019 with abdominal pain and concerns about colon mass causing small and large bowel obstruction  **RD working remotely**  Patient currently NPO for pending stent placement. Per surgery, if stent is unable to be placed, pt will require resection. Will await plan. PTA pt had poor appetite for about a month PTA with constipation, abdominal pain, and nausea.   Per patient, pt had lost 10 lbs PTA. Per weight records, pt weighed 195 lbs in February 2019.   Labs reviewed. Medications: D5 -.9% NaCl infusion at 125 ml/hr  NUTRITION - FOCUSED PHYSICAL EXAM:  Unable to perform -working remotely.  Diet Order:   Diet Order            Diet NPO time specified  Diet effective midnight              EDUCATION NEEDS:   No education needs have been identified at this time  Skin:  Skin Assessment: Reviewed RN Assessment  Last BM:  8/19  Height:   Ht Readings from Last 1 Encounters:  07/13/19 6' (1.829 m)    Weight:   Wt Readings from Last 1 Encounters:  07/13/19 78.5 kg    Ideal Body Weight:  80.9 kg  BMI:  Body mass index is 23.46 kg/m.  Estimated Nutritional Needs:   Kcal:  1900-2100  Protein:  95-105g  Fluid:  2.1L/day  Clayton Bibles, MS, RD, LDN [place] [facility] Pager: [phone] After Hours Pager:  [phone]

## 2019-07-15 NOTE — Progress Notes (Signed)
Patient Demographics:    Billy Yates, is a 58 y.o. male, DOB - 02-Sep-1961, MVH:[id number]  Admit date - 07/13/2019   Admitting Physician Reubin Milan, MD  Outpatient Primary MD for the patient is Patient, No Pcp Per  LOS - 2   Chief Complaint  Patient presents with   Abdominal Pain        Subjective:    Billy Yates today has no fevers, no emesis,  No chest pain, --had at least 3 BMs, receiving enemas --Wife at bedside, questions answered  Assessment  & Plan :    Active Problems:   Hypokalemia   Hyperbilirubinemia   Colonic mass  Brief summary -58 year old male with past medical history relevant for hypertension, prior history of treatment for TB and tobacco abuse admitted on 07/13/2019 with abdominal pain and concerns about colon mass causing small and large bowel obstruction   A/p 1)Abdominal pain and colon mass of the hepatic flexure--- -CT A/P demonstrates apple core lesion in distal transverse colon just before flexure with at least partial obstruction of proximal colon. Change in bowel habits with worsening abdominal pain and a mass noted in the transverse colon on CT scan-  --flexible sigmoidoscopy with possible stent placement by Dr. Benson Norway pending as of  07/15/2019  -If colonic stent placement exercise food and patient can be prepped and  plan for a one-stage resection.  If he is unable to place a stent then he will need a resection with colostomy. -timing of surgery will depend on success of flex sig with stent placement procedure on 07/15/2019  --GI and general surgery consult appreciated   2) Hypertension---  may use IV Hydralazine 10 mg  Every 4 hours Prn for systolic blood pressure over 160 mmhg  3)Prior H/o TB--completed multidrug regiment treatment in 2017  4) Tobacco abuse--smoking cessation advised, patient declines nicotine patch  Disposition/Need for in-Hospital  Stay- patient unable to be discharged at this time due to colonic mass requiring endoluminal evaluation and possibly colectomy -Needs IV fluids while n.p.o.*  Code Status : Full code  Family Communication:   NA (patient is alert, awake and coherent) Discussed with patient's wife at bedside  Disposition Plan  : home  Consults  :  Gi/Gen Surgery  DVT Prophylaxis  :    - Heparin - SCDs   Lab Results  Component Value Date   PLT 252 07/13/2019    Inpatient Medications  Scheduled Meds:  heparin injection (subcutaneous)  5,000 Units Subcutaneous Q8H   hydrocortisone   Rectal BID   sodium chloride flush  3 mL Intravenous Once   Continuous Infusions:  dextrose 5 % and 0.9% NaCl 125 mL/hr at 07/15/19 1112   PRN Meds:.acetaminophen **OR** acetaminophen, hydrALAZINE, HYDROmorphone (DILAUDID) injection, nicotine, prochlorperazine    Anti-infectives (From admission, onward)   None        Objective:   Vitals:   07/14/19 1324 07/14/19 1337 07/14/19 2017 07/15/19 0510  BP: (!) 166/91 (!) 152/82 (!) 169/91 (!) 158/93  Pulse: (!) 56  (!) 54 (!) 55  Resp:   16 16  Temp: 98.8 F (37.1 C)  98.2 F (36.8 C) 98 F (36.7 C)  TempSrc: Oral  Oral Oral  SpO2: 100%  100% 100%  Weight:  Height:        Wt Readings from Last 3 Encounters:  07/13/19 78.5 kg     Intake/Output Summary (Last 24 hours) at 07/15/2019 1141 Last data filed at 07/15/2019 0959 Gross per 24 hour  Intake 2968.77 ml  Output --  Net 2968.77 ml     Physical Exam  Gen:- Awake Alert,  In no apparent distress  HEENT:- [place].AT, No sclera icterus Neck-Supple Neck,No JVD,.  Lungs-  CTAB , fair symmetrical air movement CV- S1, S2 normal, regular  Abd-  +ve B.Sounds, Abd Soft, left upper quadrant area discomfort on palpation without rebound or guarding Extremity/Skin:- No  edema, pedal pulses present  Psych-affect is appropriate, oriented x3 Neuro-no new focal deficits, no tremors   Data Review:    Micro Results Recent Results (from the past 240 hour(s))  SARS Coronavirus 2 [facility] order, Performed in [facility] hospital lab) Nasopharyngeal Nasopharyngeal Swab     Status: None   Collection Time: 07/13/19  7:45 PM   Specimen: Nasopharyngeal Swab  Result Value Ref Range Status   SARS Coronavirus 2 NEGATIVE NEGATIVE Final    Comment: (NOTE) If result is NEGATIVE SARS-CoV-2 target nucleic acids are NOT DETECTED. The SARS-CoV-2 RNA is generally detectable in upper and lower  respiratory specimens during the acute phase of infection. The lowest  concentration of SARS-CoV-2 viral copies this assay can detect is 250  copies / mL. A negative result does not preclude SARS-CoV-2 infection  and should not be used as the sole basis for treatment or other  patient management decisions.  A negative result may occur with  improper specimen collection / handling, submission of specimen other  than nasopharyngeal swab, presence of viral mutation(s) within the  areas targeted by this assay, and inadequate number of viral copies  (<250 copies / mL). A negative result must be combined with clinical  observations, patient history, and epidemiological information. If result is POSITIVE SARS-CoV-2 target nucleic acids are DETECTED. The SARS-CoV-2 RNA is generally detectable in upper and lower  respiratory specimens dur ing the acute phase of infection.  Positive  results are indicative of active infection with SARS-CoV-2.  Clinical  correlation with patient history and other diagnostic information is  necessary to determine patient infection status.  Positive results do  not rule out bacterial infection or co-infection with other viruses. If result is PRESUMPTIVE POSTIVE SARS-CoV-2 nucleic acids MAY BE PRESENT.   A presumptive positive result was obtained on the submitted specimen  and confirmed on repeat testing.  While 2019 novel coronavirus  (SARS-CoV-2) nucleic acids may be present in the  submitted sample  additional confirmatory testing may be necessary for epidemiological  and / or clinical management purposes  to differentiate between  SARS-CoV-2 and other Sarbecovirus currently known to infect humans.  If clinically indicated additional testing with an alternate test  methodology [phone]) is advised. The SARS-CoV-2 RNA is generally  detectable in upper and lower respiratory sp ecimens during the acute  phase of infection. The expected result is Negative. Fact Sheet for Patients:  StrictlyIdeas.no Fact Sheet for Healthcare Providers: BankingDealers.co.za This test is not yet approved or cleared by the [place] FDA and has been authorized for detection and/or diagnosis of SARS-CoV-2 by FDA under an Emergency Use Authorization (EUA).  This EUA will remain in effect (meaning this test can be used) for the duration of the COVID-19 declaration under Section 564(b)(1) of the Act, 21 U.S.C. section 360bbb-3(b)(1), unless the authorization is terminated or revoked  sooner. Performed at [facility], [address] [place], [place] [address]     Radiology Reports X-ray Chest Pa And Lateral  Result Date: 07/13/2019 CLINICAL DATA:  Preop. EXAM: CHEST - 2 VIEW COMPARISON:  Lung bases from abdominal CT earlier this day. FINDINGS: The cardiomediastinal contours are normal. Trace right pleural effusion on CT not well visualized. Mild right apically pleuroparenchymal scarring. Pulmonary vasculature is normal. No consolidation or pneumothorax. No acute osseous abnormalities are seen. IMPRESSION: Trace right pleural effusion on CT not well visualized radiographically. Mild right apical pleuroparenchymal scarring. Electronically Signed   By: Keith Rake M.D.   On: 07/13/2019 20:48   Ct Abdomen Pelvis W Contrast  Result Date: 07/13/2019 CLINICAL DATA:  Increasing abdominal pain after eating with constipation, nausea  and vomiting for 1 week. 10 pound weight loss in the past month. EXAM: CT ABDOMEN AND PELVIS WITH CONTRAST TECHNIQUE: Multidetector CT imaging of the abdomen and pelvis was performed using the standard protocol following bolus administration of intravenous contrast. CONTRAST:  100 mL OMNIPAQUE IOHEXOL 300 MG/ML  SOLN COMPARISON:  None. FINDINGS: Lower chest: Lung bases are clear. No pleural or pericardial effusion. Hepatobiliary: No focal liver abnormality is seen. No gallstones, gallbladder wall thickening, or biliary dilatation. Pancreas: Unremarkable. No pancreatic ductal dilatation or surrounding inflammatory changes. Spleen: Normal in size without focal abnormality. Adrenals/Urinary Tract: Adrenal glands are unremarkable. Kidneys are normal, without renal calculi, focal lesion, or hydronephrosis. Bladder is unremarkable. Stomach/Bowel: The patient has a stricturing mass lesion with an "apple-core" configuration just proximal to the splenic flexure of the colon as seen on image 12 of series 2. The lesion results in obstruction of the ascending and transverse colon and small bowel. Mild distention of the stomach is also seen. No CT signs of bowel ischemia. The appendix appears normal. Vascular/Lymphatic: No significant vascular findings are present. No enlarged abdominal or pelvic lymph nodes. Reproductive: Mild prostatomegaly. Other: None. Musculoskeletal: No lytic or sclerotic lesion. Multilevel lumbar facet degenerative change is noted. IMPRESSION: Mass lesion in the colon with an apple-core configuration just proximal to the splenic flexure is most consistent with carcinoma. The lesion results in obstruction of the ascending and transverse colon and small bowel. No metastatic disease identified. Mild prostatomegaly. These results were called by telephone at the time of interpretation on 07/13/2019 at 7:15 pm to Dr. Aletta Edouard , who verbally acknowledged these results. Electronically Signed   By: Inge Rise M.D.   On: 07/13/2019 19:18    CBC Recent Labs  Lab 07/13/19 1644  WBC 9.2  HGB 16.5  HCT 48.9  PLT 252  MCV 91.7  MCH 31.0  MCHC 33.7  RDW 14.3    Chemistries  Recent Labs  Lab 07/13/19 1644  NA 138  K 3.3*  CL 101  CO2 24  GLUCOSE 111*  BUN 9  CREATININE 0.98  CALCIUM 9.6  MG 2.1  AST 27  ALT 24  ALKPHOS 83  BILITOT 2.1*  ------------------------------------------------------------------------------------------------------------------ No results for input(s): CHOL, HDL, LDLCALC, TRIG, CHOLHDL, LDLDIRECT in the last 72 hours.  No results found for: HGBA1C ------------------------------------------------------------------------------------------------------------------ No results for input(s): TSH, T4TOTAL, T3FREE, THYROIDAB in the last 72 hours.  Invalid input(s): FREET3 ------------------------------------------------------------------------------------------------------------------ No results for input(s): VITAMINB12, FOLATE, FERRITIN, TIBC, IRON, RETICCTPCT in the last 72 hours.  Coagulation profile Recent Labs  Lab 07/13/19 2200  INR 1.1   No results for input(s): DDIMER in the last 72 hours.  Cardiac Enzymes No results for input(s):  CKMB, TROPONINI, MYOGLOBIN in the last 168 hours.  Invalid input(s): CK ------------------------------------------------------------------------------------------------------------------ No results found for: BNP  Roxan Hockey M.D on 07/15/2019 at 11:41 AM  Go to [email or web address] - for contact info  [facility] - Office  [phone]

## 2019-07-15 NOTE — Progress Notes (Signed)
Patient ID: Billy Yates, male   DOB: September 15, 1961, 58 y.o.   MRN: [id number]       Subjective: No new complaints today.  Getting enemas for flex sig later.  Abdominal bloating improved.  Objective: Vital signs in last 24 hours: Temp:  [98 F (36.7 C)-98.8 F (37.1 C)] 98 F (36.7 C) (08/20 0510) Pulse Rate:  [54-56] 55 (08/20 0510) Resp:  [16] 16 (08/20 0510) BP: (152-169)/(82-93) 158/93 (08/20 0510) SpO2:  [100 %] 100 % (08/20 0510) Last BM Date: 07/14/19  Intake/Output from previous day: 08/19 0701 - 08/20 0700 In: 3088.8 [P.O.:360; I.V.:2728.8] Out: -  Intake/Output this shift: No intake/output data recorded.  PE: Heart: regular Lungs: CTAB Abd: soft, less bloated today, not really tender, +BS  Lab Results:  Recent Labs    07/13/19 1644  WBC 9.2  HGB 16.5  HCT 48.9  PLT 252   BMET Recent Labs    07/13/19 1644  NA 138  K 3.3*  CL 101  CO2 24  GLUCOSE 111*  BUN 9  CREATININE 0.98  CALCIUM 9.6   PT/INR Recent Labs    07/13/19 2200  LABPROT 14.3  INR 1.1   CMP     Component Value Date/Time   NA 138 07/13/2019 1644   K 3.3 (L) 07/13/2019 1644   CL 101 07/13/2019 1644   CO2 24 07/13/2019 1644   GLUCOSE 111 (H) 07/13/2019 1644   BUN 9 07/13/2019 1644   CREATININE 0.98 07/13/2019 1644   CALCIUM 9.6 07/13/2019 1644   PROT 8.4 (H) 07/13/2019 1644   ALBUMIN 4.6 07/13/2019 1644   AST 27 07/13/2019 1644   ALT 24 07/13/2019 1644   ALKPHOS 83 07/13/2019 1644   BILITOT 2.1 (H) 07/13/2019 1644   GFRNONAA >60 07/13/2019 1644   GFRAA >60 07/13/2019 1644   Lipase     Component Value Date/Time   LIPASE 23 07/13/2019 1644       Studies/Results: X-ray Chest Pa And Lateral  Result Date: 07/13/2019 CLINICAL DATA:  Preop. EXAM: CHEST - 2 VIEW COMPARISON:  Lung bases from abdominal CT earlier this day. FINDINGS: The cardiomediastinal contours are normal. Trace right pleural effusion on CT not well visualized. Mild right apically pleuroparenchymal  scarring. Pulmonary vasculature is normal. No consolidation or pneumothorax. No acute osseous abnormalities are seen. IMPRESSION: Trace right pleural effusion on CT not well visualized radiographically. Mild right apical pleuroparenchymal scarring. Electronically Signed   By: Keith Rake M.D.   On: 07/13/2019 20:48   Ct Abdomen Pelvis W Contrast  Result Date: 07/13/2019 CLINICAL DATA:  Increasing abdominal pain after eating with constipation, nausea and vomiting for 1 week. 10 pound weight loss in the past month. EXAM: CT ABDOMEN AND PELVIS WITH CONTRAST TECHNIQUE: Multidetector CT imaging of the abdomen and pelvis was performed using the standard protocol following bolus administration of intravenous contrast. CONTRAST:  100 mL OMNIPAQUE IOHEXOL 300 MG/ML  SOLN COMPARISON:  None. FINDINGS: Lower chest: Lung bases are clear. No pleural or pericardial effusion. Hepatobiliary: No focal liver abnormality is seen. No gallstones, gallbladder wall thickening, or biliary dilatation. Pancreas: Unremarkable. No pancreatic ductal dilatation or surrounding inflammatory changes. Spleen: Normal in size without focal abnormality. Adrenals/Urinary Tract: Adrenal glands are unremarkable. Kidneys are normal, without renal calculi, focal lesion, or hydronephrosis. Bladder is unremarkable. Stomach/Bowel: The patient has a stricturing mass lesion with an "apple-core" configuration just proximal to the splenic flexure of the colon as seen on image 12 of series 2. The lesion results  in obstruction of the ascending and transverse colon and small bowel. Mild distention of the stomach is also seen. No CT signs of bowel ischemia. The appendix appears normal. Vascular/Lymphatic: No significant vascular findings are present. No enlarged abdominal or pelvic lymph nodes. Reproductive: Mild prostatomegaly. Other: None. Musculoskeletal: No lytic or sclerotic lesion. Multilevel lumbar facet degenerative change is noted. IMPRESSION: Mass  lesion in the colon with an apple-core configuration just proximal to the splenic flexure is most consistent with carcinoma. The lesion results in obstruction of the ascending and transverse colon and small bowel. No metastatic disease identified. Mild prostatomegaly. These results were called by telephone at the time of interpretation on 07/13/2019 at 7:15 pm to Dr. Aletta Edouard , who verbally acknowledged these results. Electronically Signed   By: Inge Rise M.D.   On: 07/13/2019 19:18    Anti-infectives: Anti-infectives (From admission, onward)   None       Assessment/Plan Colon mass near splenic flexure with resultant large and small bowel obstruction -Dr. Benson Norway to try and place a stent today.  If he is able to do this then we can try to prep him and plan for a one-stage resection.  If he is unable to place a stent then he will need a resection with colostomy. -timing of surgery will depend on success of flex sig today -will follow.  FEN - NPO  VTE - heparin ID - none   LOS: 2 days    Henreitta Cea , [facility] Surgery 07/15/2019, 10:12 AM Pager: [phone]

## 2019-07-15 NOTE — Anesthesia Preprocedure Evaluation (Addendum)
Anesthesia Evaluation  Patient identified by MRN, date of birth, ID band Patient awake    Reviewed: Allergy & Precautions, NPO status , Patient's Chart, lab work & pertinent test results  History of Anesthesia Complications Negative for: history of anesthetic complications  Airway Mallampati: II  TM Distance: >3 FB Neck ROM: Full    Dental  (+) Poor Dentition, Dental Advisory Given   Pulmonary Current Smoker and Patient abstained from smoking.,    Pulmonary exam normal        Cardiovascular hypertension, Normal cardiovascular exam     Neuro/Psych negative neurological ROS     GI/Hepatic negative GI ROS, Neg liver ROS,   Endo/Other  negative endocrine ROS  Renal/GU negative Renal ROS     Musculoskeletal negative musculoskeletal ROS (+)   Abdominal   Peds  Hematology negative hematology ROS (+)   Anesthesia Other Findings Day of surgery medications reviewed with the patient.  Reproductive/Obstetrics                            Anesthesia Physical Anesthesia Plan  ASA: II  Anesthesia Plan: MAC   Post-op Pain Management:    Induction:   PONV Risk Score and Plan: Ondansetron and Propofol infusion  Airway Management Planned: Natural Airway and Simple Face Mask  Additional Equipment:   Intra-op Plan:   Post-operative Plan:   Informed Consent: I have reviewed the patients History and Physical, chart, labs and discussed the procedure including the risks, benefits and alternatives for the proposed anesthesia with the patient or authorized representative who has indicated his/her understanding and acceptance.     Dental advisory given  Plan Discussed with: Anesthesiologist  Anesthesia Plan Comments:        Anesthesia Quick Evaluation

## 2019-07-15 NOTE — Transfer of Care (Signed)
Immediate Anesthesia Transfer of Care Note  Patient: Billy Yates  Procedure(s) Performed: FLEXIBLE SIGMOIDOSCOPY (Left ) COLONIC STENT PLACEMENT (N/A )  Patient Location: PACU  Anesthesia Type:MAC  Level of Consciousness: sedated, patient cooperative and responds to stimulation  Airway & Oxygen Therapy: Patient Spontanous Breathing and Patient connected to face mask oxygen  Post-op Assessment: Report given to RN and Post -op Vital signs reviewed and stable  Post vital signs: Reviewed and stable  Last Vitals:  Vitals Value Taken Time  BP    Temp    Pulse 83 07/15/19 1535  Resp 19 07/15/19 1535  SpO2 100 % 07/15/19 1535  Vitals shown include unvalidated device data.  Last Pain:  Vitals:   07/15/19 1358  TempSrc: Oral  PainSc: 2       Patients Stated Pain Goal: 2 (01/56/15 3794)  Complications: No apparent anesthesia complications

## 2019-07-16 ENCOUNTER — Encounter (HOSPITAL_COMMUNITY): Payer: Self-pay | Admitting: Gastroenterology

## 2019-07-16 MED ORDER — NICOTINE 14 MG/24HR TD PT24: 14.0000 mg | MEDICATED_PATCH | Freq: Every day | TRANSDERMAL | Status: DC | PRN

## 2019-07-16 MED ORDER — GABAPENTIN 300 MG PO CAPS: 300.0000 mg | ORAL_CAPSULE | ORAL | Status: DC

## 2019-07-16 MED ORDER — ALVIMOPAN 12 MG PO CAPS
12.0000 mg | ORAL_CAPSULE | ORAL | Status: AC
  Administered 2019-07-19: 12 mg via ORAL
  Filled 2019-07-16: qty 1

## 2019-07-16 MED ORDER — ACETAMINOPHEN 500 MG PO TABS: 1000.0000 mg | ORAL_TABLET | ORAL | Status: DC

## 2019-07-16 MED ORDER — SENNOSIDES-DOCUSATE SODIUM 8.6-50 MG PO TABS
2.0000 | ORAL_TABLET | Freq: Two times a day (BID) | ORAL | Status: DC
  Administered 2019-07-16 – 2019-07-18 (×5): 2 via ORAL
  Filled 2019-07-16 (×6): qty 2

## 2019-07-16 MED ORDER — BUPIVACAINE LIPOSOME 1.3 % IJ SUSP: 20.0000 mL | Freq: Once | INTRAMUSCULAR | Status: DC

## 2019-07-16 MED ORDER — SODIUM CHLORIDE 0.9 % IV SOLN
2.0000 g | INTRAVENOUS | Status: AC
  Administered 2019-07-19: 13:00:00 2 g via INTRAVENOUS
  Filled 2019-07-16 (×2): qty 2

## 2019-07-16 MED ORDER — HYDROCORTISONE ACETATE 25 MG RE SUPP
25.0000 mg | Freq: Two times a day (BID) | RECTAL | Status: DC
  Administered 2019-07-16 – 2019-07-18 (×5): 25 mg via RECTAL
  Filled 2019-07-16 (×7): qty 1

## 2019-07-16 NOTE — Plan of Care (Signed)
Patient lying in bed this morning; pain controlled at this time. No further needs expressed. Will continue to monitor.

## 2019-07-16 NOTE — Progress Notes (Signed)
Subjective: Feeling better.  + bowel movements.  Objective: Vital signs in last 24 hours: Temp:  [97.9 F (36.6 C)-98.9 F (37.2 C)] 98.6 F (37 C) (08/21 1424) Pulse Rate:  [55-70] 55 (08/21 1424) Resp:  [16-20] 20 (08/21 1424) BP: (147-176)/(81-101) 154/95 (08/21 1424) SpO2:  [99 %-100 %] 100 % (08/21 1424) Last BM Date: 07/15/19  Intake/Output from previous day: 08/20 0701 - 08/21 0700 In: 2842.7 [P.O.:60; I.V.:2782.7] Out: -  Intake/Output this shift: No intake/output data recorded.  General appearance: alert and no distress GI: soft, decreased firmness, tympanic  Lab Results: Recent Labs    07/13/19 1644  WBC 9.2  HGB 16.5  HCT 48.9  PLT 252   BMET Recent Labs    07/13/19 1644  NA 138  K 3.3*  CL 101  CO2 24  GLUCOSE 111*  BUN 9  CREATININE 0.98  CALCIUM 9.6   LFT Recent Labs    07/13/19 1644  PROT 8.4*  ALBUMIN 4.6  AST 27  ALT 24  ALKPHOS 83  BILITOT 2.1*   PT/INR Recent Labs    07/13/19 2200  LABPROT 14.3  INR 1.1   Hepatitis Panel No results for input(s): HEPBSAG, HCVAB, HEPAIGM, HEPBIGM in the last 72 hours. C-Diff No results for input(s): CDIFFTOX in the last 72 hours. Fecal Lactopherrin No results for input(s): FECLLACTOFRN in the last 72 hours.  Studies/Results: Dg Ercp Biliary & Pancreatic Ducts  Result Date: 07/16/2019 CLINICAL DATA:  58 year old male with a history of colonic stent placement EXAM: COLONOSCOPY/SIGMOIDOSCOPY AND TREATMENT TECHNIQUE: Multiple spot images obtained with the fluoroscopic device and submitted for interpretation post-procedure. FLUOROSCOPY TIME:  Fluoroscopy Time:  3 minutes 10 seconds COMPARISON:  None. FINDINGS: Limited images during sigmoidoscopy/colonoscopy demonstrates placement of a palliative colonic stent. IMPRESSION: Limited images demonstrate placement of palliative colonic stent. Please refer to the dictated operative report for full details of intraoperative findings and procedure.  Electronically Signed   By: Corrie Mckusick D.O.   On: 07/16/2019 12:41    Medications:  Scheduled: . [START ON 07/19/2019] acetaminophen  1,000 mg Oral On Call to OR  . [START ON 07/19/2019] alvimopan  12 mg Oral On Call to OR  . feeding supplement  1 Container Oral TID BM  . heparin injection (subcutaneous)  5,000 Units Subcutaneous Q8H  . hydrocortisone   Rectal BID  . sodium chloride flush  3 mL Intravenous Once   Continuous: . [START ON 07/19/2019] cefoTEtan (CEFOTAN) IV    . dextrose 5 % and 0.9% NaCl 125 mL/hr at 07/16/19 0751  . lactated ringers Stopped (07/15/19 1550)    Assessment/Plan: 1) Distal transverse obstructive colon cancer.   Clinically he is improving.  The patient is having bowel movements.  Plan: 1) Resection per surgery. 2) He will need to have a complete colonoscopy in a year. 3) Signing off.  LOS: 3 days   Andranik Jeune D 07/16/2019, 3:49 PM

## 2019-07-16 NOTE — Progress Notes (Signed)
Patient ID: Billy Yates, male   DOB: [birth year], 58 y.o.   MRN: [id number]    1 Day Post-Op  Subjective: Some intermittent crampy pain, but passing a lot of air and stool now that he has had a stent placed.  No nausea.  Tolerating some liquids and his breeze protein drink.  Objective: Vital signs in last 24 hours: Temp:  [97.7 F (36.5 C)-99.2 F (37.3 C)] 98.3 F (36.8 C) (08/21 0500) Pulse Rate:  [55-83] 56 (08/21 0500) Resp:  [16-20] 16 (08/21 0500) BP: (147-176)/(81-112) 147/81 (08/21 0500) SpO2:  [99 %-100 %] 99 % (08/21 0500) Weight:  [78.5 kg] 78.5 kg (08/20 1358) Last BM Date: 07/15/19  Intake/Output from previous day: 08/20 0701 - 08/21 0700 In: 2842.7 [P.O.:60; I.V.:2782.7] Out: -  Intake/Output this shift: No intake/output data recorded.  PE: Abd: soft, mild tenderness in epigastrium, +BS, ND  Lab Results:  Recent Labs    07/13/19 1644  WBC 9.2  HGB 16.5  HCT 48.9  PLT 252   BMET Recent Labs    07/13/19 1644  NA 138  K 3.3*  CL 101  CO2 24  GLUCOSE 111*  BUN 9  CREATININE 0.98  CALCIUM 9.6   PT/INR Recent Labs    07/13/19 2200  LABPROT 14.3  INR 1.1   CMP     Component Value Date/Time   NA 138 07/13/2019 1644   K 3.3 (L) 07/13/2019 1644   CL 101 07/13/2019 1644   CO2 24 07/13/2019 1644   GLUCOSE 111 (H) 07/13/2019 1644   BUN 9 07/13/2019 1644   CREATININE 0.98 07/13/2019 1644   CALCIUM 9.6 07/13/2019 1644   PROT 8.4 (H) 07/13/2019 1644   ALBUMIN 4.6 07/13/2019 1644   AST 27 07/13/2019 1644   ALT 24 07/13/2019 1644   ALKPHOS 83 07/13/2019 1644   BILITOT 2.1 (H) 07/13/2019 1644   GFRNONAA >60 07/13/2019 1644   GFRAA >60 07/13/2019 1644   Lipase     Component Value Date/Time   LIPASE 23 07/13/2019 1644       Studies/Results: No results found.  Anti-infectives: Anti-infectives (From admission, onward)   None       Assessment/Plan Colon mass near splenic flexure with resultant large and small bowel obstruction  -Dr. Benson Norway able to place a stent in this lesion.  We will plan to prep him over the weekend with hopefully a one stage procedure on Monday with Dr. Marlou Starks.  -remain on clear liquids only and breeze -baseline CEA 17 -prealbumin 14.5, cont breeze  FEN - CLD, breeze VTE - heparin ID - none   LOS: 3 days    Henreitta Cea , [facility] Surgery 07/16/2019, 9:49 AM Pager: [phone]

## 2019-07-16 NOTE — Progress Notes (Signed)
Patient Demographics:    Billy Yates, is a 58 y.o. male, DOB - 1961/05/28, ZT:[id number]  Admit date - 07/13/2019   Admitting Physician Reubin Milan, MD  Outpatient Primary MD for the patient is Patient, No Pcp Per  LOS - 3   Chief Complaint  Patient presents with   Abdominal Pain        Subjective:    Billy Yates today has no fevers, no emesis,  No chest pain,  -Tolerating clear liquids, had BM  Assessment  & Plan :    Active Problems:   Hypokalemia   Hyperbilirubinemia   Colonic mass  Brief summary -58 year old male with past medical history relevant for hypertension, prior history of treatment for TB and tobacco abuse admitted on 07/13/2019 with abdominal pain and concerns about colon mass causing small and large bowel obstruction Going to  OR on Monday 07/19/19 for colectomy   A/p 1)Abdominal pain and colon mass of the distal transverse colon/splenic flexure--- -CT A/P demonstrates apple core lesion in distal transverse colon just before flexure with at least partial obstruction of proximal colon. Change in bowel habits with worsening abdominal pain and a mass noted in the transverse colon on CT scan-  --flexible sigmoidoscopy with successful stent placement by Dr. Benson Norway done on 07/15/2019  -- --GI and general surgery consult appreciated -Going to  OR on Monday 07/19/19 for 1 stage procedure/colectomy with Dr. 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-Needs IV fluids while  undergoing colon prep with loose stools  Code Status : Full code  Family Communication:   NA (patient is alert, awake and coherent) Discussed with patient's wife   Disposition Plan  : home  Consults  :  Gi/Gen Surgery -GI signed off 07/16/2019  DVT Prophylaxis  :    - Heparin - SCDs   Lab Results  Component Value Date   PLT 252 07/13/2019    Inpatient Medications  Scheduled Meds:  [START ON 07/19/2019] acetaminophen  1,000 mg Oral On Call to OR   [START ON 07/19/2019] alvimopan  12 mg Oral On Call to OR   feeding supplement  1 Container Oral TID BM   heparin injection (subcutaneous)  5,000 Units Subcutaneous Q8H   hydrocortisone   Rectal BID   sodium chloride flush  3 mL Intravenous Once   Continuous Infusions:  [START ON 07/19/2019] cefoTEtan (CEFOTAN) IV     dextrose 5 % and 0.9% NaCl 125 mL/hr at 07/16/19 0751   lactated ringers Stopped (07/15/19 1550)   PRN Meds:.acetaminophen **OR** acetaminophen, hydrALAZINE, HYDROmorphone (DILAUDID) injection, nicotine, prochlorperazine    Anti-infectives (From admission, onward)   Start     Dose/Rate Route Frequency Ordered Stop   07/19/19 0600  cefoTEtan (CEFOTAN) 2 g in sodium chloride 0.9 % 100 mL IVPB     2 g 200 mL/hr over 30 Minutes Intravenous On call to O.R. 07/16/19  1022 07/20/19 0559        Objective:   Vitals:   07/15/19 1843 07/15/19 2151 07/16/19 0500 07/16/19 1424  BP: (!) 160/93 (!) 165/91 (!) 147/81 (!) 154/95  Pulse: 60 61 (!) 56 (!) 55  Resp:  16 16 20   Temp: 97.9 F (36.6 C) 98.4 F (36.9 C) 98.3 F (36.8 C) 98.6 F (37 C)  TempSrc: Oral Oral Oral Oral  SpO2: 100% 100% 99% 100%  Weight:      Height:        Wt Readings from Last 3 Encounters:  07/15/19 78.5 kg    Intake/Output Summary (Last 24 hours) at 07/16/2019 1550 Last data filed at 07/16/2019 0501 Gross per 24 hour  Intake 1484.67 ml  Output --  Net 1484.67 ml    Physical Exam  Gen:- Awake Alert,  In no apparent distress   HEENT:- [place].AT, No sclera icterus Neck-Supple Neck,No JVD,.  Lungs-  CTAB , fair symmetrical air movement CV- S1, S2 normal, regular  Abd-  +ve B.Sounds, Abd Soft, left upper quadrant area discomfort on palpation without rebound or guarding Extremity/Skin:- No  edema, pedal pulses present  Psych-affect is appropriate, oriented x3 Neuro-no new focal deficits, no tremors   Data Review:   Micro Results Recent Results (from the past 240 hour(s))  SARS Coronavirus 2 [facility] order, Performed in [facility] hospital lab) Nasopharyngeal Nasopharyngeal Swab     Status: None   Collection Time: 07/13/19  7:45 PM   Specimen: Nasopharyngeal Swab  Result Value Ref Range Status   SARS Coronavirus 2 NEGATIVE NEGATIVE Final    Comment: (NOTE) If result is NEGATIVE SARS-CoV-2 target nucleic acids are NOT DETECTED. The SARS-CoV-2 RNA is generally detectable in upper and lower  respiratory specimens during the acute phase of infection. The lowest  concentration of SARS-CoV-2 viral copies this assay can detect is 250  copies / mL. A negative result does not preclude SARS-CoV-2 infection  and should not be used as the sole basis for treatment or other  patient management decisions.  A negative result may occur with  improper specimen collection / handling, submission of specimen other  than nasopharyngeal swab, presence of viral mutation(s) within the  areas targeted by this assay, and inadequate number of viral copies  (<250 copies / mL). A negative result must be combined with clinical  observations, patient history, and epidemiological information. If result is POSITIVE SARS-CoV-2 target nucleic acids are DETECTED. The SARS-CoV-2 RNA is generally detectable in upper and lower  respiratory specimens dur ing the acute phase of infection.  Positive  results are indicative of active infection with SARS-CoV-2.  Clinical  correlation with patient history and other diagnostic information is   necessary to determine patient infection status.  Positive results do  not rule out bacterial infection or co-infection with other viruses. If result is PRESUMPTIVE POSTIVE SARS-CoV-2 nucleic acids MAY BE PRESENT.   A presumptive positive result was obtained on the submitted specimen  and confirmed on repeat testing.  While 2019 novel coronavirus  (SARS-CoV-2) nucleic acids may be present in the submitted sample  additional confirmatory testing may be necessary for epidemiological  and / or clinical management purposes  to differentiate between  SARS-CoV-2 and other Sarbecovirus currently known to infect humans.  If clinically indicated additional testing with an alternate test  methodology [phone]) is advised. The SARS-CoV-2 RNA is generally  detectable in upper and lower respiratory sp ecimens during the acute  phase of infection. The expected  result is Negative. Fact Sheet for Patients:  StrictlyIdeas.no Fact Sheet for Healthcare Providers: BankingDealers.co.za This test is not yet approved or cleared by the [place] FDA and has been authorized for detection and/or diagnosis of SARS-CoV-2 by FDA under an Emergency Use Authorization (EUA).  This EUA will remain in effect (meaning this test can be used) for the duration of the COVID-19 declaration under Section 564(b)(1) of the Act, 21 U.S.C. section 360bbb-3(b)(1), unless the authorization is terminated or revoked sooner. Performed at [facility], [address] [place], [place] [address]     Radiology Reports X-ray Chest Pa And Lateral  Result Date: 07/13/2019 CLINICAL DATA:  Preop. EXAM: CHEST - 2 VIEW COMPARISON:  Lung bases from abdominal CT earlier this day. FINDINGS: The cardiomediastinal contours are normal. Trace right pleural effusion on CT not well visualized. Mild right apically pleuroparenchymal scarring. Pulmonary vasculature is normal. No  consolidation or pneumothorax. No acute osseous abnormalities are seen. IMPRESSION: Trace right pleural effusion on CT not well visualized radiographically. Mild right apical pleuroparenchymal scarring. Electronically Signed   By: Keith Rake M.D.   On: 07/13/2019 20:48   Ct Abdomen Pelvis W Contrast  Result Date: 07/13/2019 CLINICAL DATA:  Increasing abdominal pain after eating with constipation, nausea and vomiting for 1 week. 10 pound weight loss in the past month. EXAM: CT ABDOMEN AND PELVIS WITH CONTRAST TECHNIQUE: Multidetector CT imaging of the abdomen and pelvis was performed using the standard protocol following bolus administration of intravenous contrast. CONTRAST:  100 mL OMNIPAQUE IOHEXOL 300 MG/ML  SOLN COMPARISON:  None. FINDINGS: Lower chest: Lung bases are clear. No pleural or pericardial effusion. Hepatobiliary: No focal liver abnormality is seen. No gallstones, gallbladder wall thickening, or biliary dilatation. Pancreas: Unremarkable. No pancreatic ductal dilatation or surrounding inflammatory changes. Spleen: Normal in size without focal abnormality. Adrenals/Urinary Tract: Adrenal glands are unremarkable. Kidneys are normal, without renal calculi, focal lesion, or hydronephrosis. Bladder is unremarkable. Stomach/Bowel: The patient has a stricturing mass lesion with an "apple-core" configuration just proximal to the splenic flexure of the colon as seen on image 12 of series 2. The lesion results in obstruction of the ascending and transverse colon and small bowel. Mild distention of the stomach is also seen. No CT signs of bowel ischemia. The appendix appears normal. Vascular/Lymphatic: No significant vascular findings are present. No enlarged abdominal or pelvic lymph nodes. Reproductive: Mild prostatomegaly. Other: None. Musculoskeletal: No lytic or sclerotic lesion. Multilevel lumbar facet degenerative change is noted. IMPRESSION: Mass lesion in the colon with an apple-core  configuration just proximal to the splenic flexure is most consistent with carcinoma. The lesion results in obstruction of the ascending and transverse colon and small bowel. No metastatic disease identified. Mild prostatomegaly. These results were called by telephone at the time of interpretation on 07/13/2019 at 7:15 pm to Dr. Aletta Edouard , who verbally acknowledged these results. Electronically Signed   By: Inge Rise M.D.   On: 07/13/2019 19:18   Dg Ercp Biliary & Pancreatic Ducts  Result Date: 07/16/2019 CLINICAL DATA:  58 year old male with a history of colonic stent placement EXAM: COLONOSCOPY/SIGMOIDOSCOPY AND TREATMENT TECHNIQUE: Multiple spot images obtained with the fluoroscopic device and submitted for interpretation post-procedure. FLUOROSCOPY TIME:  Fluoroscopy Time:  3 minutes 10 seconds COMPARISON:  None. FINDINGS: Limited images during sigmoidoscopy/colonoscopy demonstrates placement of a palliative colonic stent. IMPRESSION: Limited images demonstrate placement of palliative colonic stent. Please refer to the dictated operative report for full details of  intraoperative findings and procedure. Electronically Signed   By: Corrie Mckusick D.O.   On: 07/16/2019 12:41    CBC Recent Labs  Lab 07/13/19 1644  WBC 9.2  HGB 16.5  HCT 48.9  PLT 252  MCV 91.7  MCH 31.0  MCHC 33.7  RDW 14.3    Chemistries  Recent Labs  Lab 07/13/19 1644  NA 138  K 3.3*  CL 101  CO2 24  GLUCOSE 111*  BUN 9  CREATININE 0.98  CALCIUM 9.6  MG 2.1  AST 27  ALT 24  ALKPHOS 83  BILITOT 2.1*  ------------------------------------------------------------------------------------------------------------------ No results for input(s): CHOL, HDL, LDLCALC, TRIG, CHOLHDL, LDLDIRECT in the last 72 hours.  No results found for: HGBA1C ------------------------------------------------------------------------------------------------------------------ No results for input(s): TSH, T4TOTAL, T3FREE,  THYROIDAB in the last 72 hours.  Invalid input(s): FREET3 ------------------------------------------------------------------------------------------------------------------ No results for input(s): VITAMINB12, FOLATE, FERRITIN, TIBC, IRON, RETICCTPCT in the last 72 hours.  Coagulation profile Recent Labs  Lab 07/13/19 2200  INR 1.1   No results for input(s): DDIMER in the last 72 hours.  Cardiac Enzymes No results for input(s): CKMB, TROPONINI, MYOGLOBIN in the last 168 hours.  Invalid input(s): CK ------------------------------------------------------------------------------------------------------------------ No results found for: BNP  Roxan Hockey M.D on 07/16/2019 at 3:50 PM  Go to [email or web address] - for contact info  [facility] - Office  [phone]

## 2019-07-17 MED ORDER — POLYETHYLENE GLYCOL 3350 17 GM/SCOOP PO POWD
0.5000 | Freq: Once | ORAL | Status: AC
  Administered 2019-07-17: 127.5 g via ORAL
  Filled 2019-07-17: qty 255

## 2019-07-17 NOTE — Progress Notes (Signed)
Patient Demographics:    Billy Yates, is a 58 y.o. male, DOB - [birth year], [id number]  Admit date - 07/13/2019   Admitting Physician Reubin Milan, MD  Outpatient Primary MD for the patient is Patient, No Pcp Per  LOS - 4   Chief Complaint  Patient presents with   Abdominal Pain        Subjective:    Billy Yates today has no fevers, no emesis,  No chest pain,  -Tolerating clear liquids, passing gas, starting colon prep -Wife at bedside questions answered  Assessment  & Plan :    Active Problems:   Hypokalemia   Hyperbilirubinemia   Colonic mass  Brief summary -58 year old male with past medical history relevant for hypertension, prior history of treatment for TB and tobacco abuse admitted on 07/13/2019 with abdominal pain and concerns about colon mass causing small and large bowel obstruction Going to  OR on Monday 07/19/19 for colectomy   A/p 1)Abdominal pain and colon mass of the distal transverse colon/splenic flexure--- -CT A/P demonstrates apple core lesion in distal transverse colon just before flexure with at least partial obstruction of proximal colon. Change in bowel habits with worsening abdominal pain and a mass noted in the transverse colon on CT scan-  --flexible sigmoidoscopy with successful stent placement by Dr. Benson Norway done on 07/15/2019  -- --GI and general surgery consult appreciated -Going to  OR on Monday 07/19/19 for 1 stage procedure/colectomy with Dr. 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-Needs IV fluids while undergoing colon prep with loose stools  Code Status : Full code  Family Communication:   NA (patient is alert, awake and coherent) Discussed with patient's wife   Disposition Plan  : home  Consults  :  Gi/Gen Surgery -GI signed off 07/16/2019  DVT Prophylaxis  :    - Heparin - SCDs   Lab Results  Component Value Date   PLT 252 07/13/2019    Inpatient Medications  Scheduled Meds:  [START ON 07/19/2019] acetaminophen  1,000 mg Oral On Call to OR   [START ON 07/19/2019] alvimopan  12 mg Oral On Call to OR   feeding supplement  1 Container Oral TID BM   heparin injection (subcutaneous)  5,000 Units Subcutaneous Q8H   hydrocortisone  25 mg Rectal BID   senna-docusate  2 tablet Oral BID   sodium chloride flush  3 mL Intravenous Once   Continuous Infusions:  [START ON 07/19/2019] cefoTEtan (CEFOTAN) IV     dextrose 5 % and 0.9% NaCl 125 mL/hr at 07/16/19 0751   lactated ringers Stopped (07/15/19 1550)   PRN Meds:.acetaminophen **OR** acetaminophen, hydrALAZINE, HYDROmorphone (DILAUDID) injection, nicotine, prochlorperazine    Anti-infectives (From admission, onward)   Start     Dose/Rate Route Frequency Ordered Stop   07/19/19 0600  cefoTEtan (CEFOTAN) 2 g in sodium chloride 0.9 % 100 mL IVPB  2 g 200 mL/hr over 30 Minutes Intravenous On call to O.R. 07/16/19 1022 07/20/19 0559        Objective:   Vitals:   07/16/19 1424 07/16/19 2243 07/17/19 0455 07/17/19 1305  BP: (!) 154/95 123/79 (!) 147/95 (!) 167/87  Pulse: (!) 55 (!) 59 61 64  Resp: 20 16 16 17   Temp: 98.6 F (37 C) 98.8 F (37.1 C) 98.8 F (37.1 C) 98.6 F (37 C)  TempSrc: Oral Oral Oral Oral  SpO2: 100% 100% 99% 100%  Weight:      Height:        Wt Readings from Last 3 Encounters:  07/15/19 78.5 kg    Intake/Output Summary (Last 24 hours) at 07/17/2019 1853 Last data filed at 07/17/2019 1400 Gross per 24 hour  Intake  1714 ml  Output 1125 ml  Net 589 ml    Physical Exam  Gen:- Awake Alert,  In no apparent distress  HEENT:- [place].AT, No sclera icterus Neck-Supple Neck,No JVD,.  Lungs-  CTAB , fair symmetrical air movement CV- S1, S2 normal, regular  Abd-  +ve B.Sounds, Abd Soft, left upper quadrant area discomfort on palpation without rebound or guarding Extremity/Skin:- No  edema, pedal pulses present  Psych-affect is appropriate, oriented x3 Neuro-no new focal deficits, no tremors   Data Review:   Micro Results Recent Results (from the past 240 hour(s))  SARS Coronavirus 2 [facility] order, Performed in [facility] hospital lab) Nasopharyngeal Nasopharyngeal Swab     Status: None   Collection Time: 07/13/19  7:45 PM   Specimen: Nasopharyngeal Swab  Result Value Ref Range Status   SARS Coronavirus 2 NEGATIVE NEGATIVE Final    Comment: (NOTE) If result is NEGATIVE SARS-CoV-2 target nucleic acids are NOT DETECTED. The SARS-CoV-2 RNA is generally detectable in upper and lower  respiratory specimens during the acute phase of infection. The lowest  concentration of SARS-CoV-2 viral copies this assay can detect is 250  copies / mL. A negative result does not preclude SARS-CoV-2 infection  and should not be used as the sole basis for treatment or other  patient management decisions.  A negative result may occur with  improper specimen collection / handling, submission of specimen other  than nasopharyngeal swab, presence of viral mutation(s) within the  areas targeted by this assay, and inadequate number of viral copies  (<250 copies / mL). A negative result must be combined with clinical  observations, patient history, and epidemiological information. If result is POSITIVE SARS-CoV-2 target nucleic acids are DETECTED. The SARS-CoV-2 RNA is generally detectable in upper and lower  respiratory specimens dur ing the acute phase of infection.  Positive  results are indicative of active infection  with SARS-CoV-2.  Clinical  correlation with patient history and other diagnostic information is  necessary to determine patient infection status.  Positive results do  not rule out bacterial infection or co-infection with other viruses. If result is PRESUMPTIVE POSTIVE SARS-CoV-2 nucleic acids MAY BE PRESENT.   A presumptive positive result was obtained on the submitted specimen  and confirmed on repeat testing.  While 2019 novel coronavirus  (SARS-CoV-2) nucleic acids may be present in the submitted sample  additional confirmatory testing may be necessary for epidemiological  and / or clinical management purposes  to differentiate between  SARS-CoV-2 and other Sarbecovirus currently known to infect humans.  If clinically indicated additional testing with an alternate test  methodology [id number]) is advised. The SARS-CoV-2 RNA is generally  detectable in upper and  lower respiratory sp ecimens during the acute  phase of infection. The expected result is Negative. Fact Sheet for Patients:  StrictlyIdeas.no Fact Sheet for Healthcare Providers: BankingDealers.co.za This test is not yet approved or cleared by the [place] FDA and has been authorized for detection and/or diagnosis of SARS-CoV-2 by FDA under an Emergency Use Authorization (EUA).  This EUA will remain in effect (meaning this test can be used) for the duration of the COVID-19 declaration under Section 564(b)(1) of the Act, 21 U.S.C. section 360bbb-3(b)(1), unless the authorization is terminated or revoked sooner. Performed at [facility], [address] [place], [place] [address]     Radiology Reports X-ray Chest Pa And Lateral  Result Date: 07/13/2019 CLINICAL DATA:  Preop. EXAM: CHEST - 2 VIEW COMPARISON:  Lung bases from abdominal CT earlier this day. FINDINGS: The cardiomediastinal contours are normal. Trace right pleural effusion on CT not well  visualized. Mild right apically pleuroparenchymal scarring. Pulmonary vasculature is normal. No consolidation or pneumothorax. No acute osseous abnormalities are seen. IMPRESSION: Trace right pleural effusion on CT not well visualized radiographically. Mild right apical pleuroparenchymal scarring. Electronically Signed   By: Keith Rake M.D.   On: 07/13/2019 20:48   Ct Abdomen Pelvis W Contrast  Result Date: 07/13/2019 CLINICAL DATA:  Increasing abdominal pain after eating with constipation, nausea and vomiting for 1 week. 10 pound weight loss in the past month. EXAM: CT ABDOMEN AND PELVIS WITH CONTRAST TECHNIQUE: Multidetector CT imaging of the abdomen and pelvis was performed using the standard protocol following bolus administration of intravenous contrast. CONTRAST:  100 mL OMNIPAQUE IOHEXOL 300 MG/ML  SOLN COMPARISON:  None. FINDINGS: Lower chest: Lung bases are clear. No pleural or pericardial effusion. Hepatobiliary: No focal liver abnormality is seen. No gallstones, gallbladder wall thickening, or biliary dilatation. Pancreas: Unremarkable. No pancreatic ductal dilatation or surrounding inflammatory changes. Spleen: Normal in size without focal abnormality. Adrenals/Urinary Tract: Adrenal glands are unremarkable. Kidneys are normal, without renal calculi, focal lesion, or hydronephrosis. Bladder is unremarkable. Stomach/Bowel: The patient has a stricturing mass lesion with an "apple-core" configuration just proximal to the splenic flexure of the colon as seen on image 12 of series 2. The lesion results in obstruction of the ascending and transverse colon and small bowel. Mild distention of the stomach is also seen. No CT signs of bowel ischemia. The appendix appears normal. Vascular/Lymphatic: No significant vascular findings are present. No enlarged abdominal or pelvic lymph nodes. Reproductive: Mild prostatomegaly. Other: None. Musculoskeletal: No lytic or sclerotic lesion. Multilevel lumbar facet  degenerative change is noted. IMPRESSION: Mass lesion in the colon with an apple-core configuration just proximal to the splenic flexure is most consistent with carcinoma. The lesion results in obstruction of the ascending and transverse colon and small bowel. No metastatic disease identified. Mild prostatomegaly. These results were called by telephone at the time of interpretation on 07/13/2019 at 7:15 pm to Dr. Aletta Edouard , who verbally acknowledged these results. Electronically Signed   By: Inge Rise M.D.   On: 07/13/2019 19:18   Dg Ercp Biliary & Pancreatic Ducts  Result Date: 07/16/2019 CLINICAL DATA:  58 year old male with a history of colonic stent placement EXAM: COLONOSCOPY/SIGMOIDOSCOPY AND TREATMENT TECHNIQUE: Multiple spot images obtained with the fluoroscopic device and submitted for interpretation post-procedure. FLUOROSCOPY TIME:  Fluoroscopy Time:  3 minutes 10 seconds COMPARISON:  None. FINDINGS: Limited images during sigmoidoscopy/colonoscopy demonstrates placement of a palliative colonic stent. IMPRESSION: Limited images demonstrate placement of palliative  colonic stent. Please refer to the dictated operative report for full details of intraoperative findings and procedure. Electronically Signed   By: Corrie Mckusick D.O.   On: 07/16/2019 12:41    CBC Recent Labs  Lab 07/13/19 1644  WBC 9.2  HGB 16.5  HCT 48.9  PLT 252  MCV 91.7  MCH 31.0  MCHC 33.7  RDW 14.3    Chemistries  Recent Labs  Lab 07/13/19 1644  NA 138  K 3.3*  CL 101  CO2 24  GLUCOSE 111*  BUN 9  CREATININE 0.98  CALCIUM 9.6  MG 2.1  AST 27  ALT 24  ALKPHOS 83  BILITOT 2.1*  ------------------------------------------------------------------------------------------------------------------ No results for input(s): CHOL, HDL, LDLCALC, TRIG, CHOLHDL, LDLDIRECT in the last 72 hours.  No results found for:  HGBA1C ------------------------------------------------------------------------------------------------------------------ No results for input(s): TSH, T4TOTAL, T3FREE, THYROIDAB in the last 72 hours.  Invalid input(s): FREET3 ------------------------------------------------------------------------------------------------------------------ No results for input(s): VITAMINB12, FOLATE, FERRITIN, TIBC, IRON, RETICCTPCT in the last 72 hours.  Coagulation profile Recent Labs  Lab 07/13/19 2200  INR 1.1   No results for input(s): DDIMER in the last 72 hours.  Cardiac Enzymes No results for input(s): CKMB, TROPONINI, MYOGLOBIN in the last 168 hours.  Invalid input(s): CK ------------------------------------------------------------------------------------------------------------------ No results found for: BNP  Roxan Hockey M.D on 07/17/2019 at 6:53 PM  Go to [email or web address] - for contact info  [facility] - Office  [phone]

## 2019-07-17 NOTE — Progress Notes (Signed)
Patient ID: Billy Yates, male   DOB: 1960/12/24, 58 y.o.   MRN: [id number]    2 Days Post-Op  Subjective: Doing better.  Tolerating clears with BMs and flatus, but having crampy pain when he drinks.    Objective: Vital signs in last 24 hours: Temp:  [98.6 F (37 C)-98.8 F (37.1 C)] 98.8 F (37.1 C) (08/22 0455) Pulse Rate:  [55-61] 61 (08/22 0455) Resp:  [16-20] 16 (08/22 0455) BP: (123-154)/(79-95) 147/95 (08/22 0455) SpO2:  [99 %-100 %] 99 % (08/22 0455) Last BM Date: 07/15/19  Intake/Output from previous day: 08/21 0701 - 08/22 0700 In: 2454 [I.V.:2454] Out: 400 [Urine:400] Intake/Output this shift: Total I/O In: 240 [P.O.:240] Out: 525 [Urine:525]  PE: A&O x 3. Ambulating easily. CV regular Pulm - breathing comfortably Abd: soft, ND, sl tenderness in upper abdomen.    Lab Results:  No results for input(s): WBC, HGB, HCT, PLT in the last 72 hours. BMET No results for input(s): NA, K, CL, CO2, GLUCOSE, BUN, CREATININE, CALCIUM in the last 72 hours. PT/INR No results for input(s): LABPROT, INR in the last 72 hours. CMP     Component Value Date/Time   NA 138 07/13/2019 1644   K 3.3 (L) 07/13/2019 1644   CL 101 07/13/2019 1644   CO2 24 07/13/2019 1644   GLUCOSE 111 (H) 07/13/2019 1644   BUN 9 07/13/2019 1644   CREATININE 0.98 07/13/2019 1644   CALCIUM 9.6 07/13/2019 1644   PROT 8.4 (H) 07/13/2019 1644   ALBUMIN 4.6 07/13/2019 1644   AST 27 07/13/2019 1644   ALT 24 07/13/2019 1644   ALKPHOS 83 07/13/2019 1644   BILITOT 2.1 (H) 07/13/2019 1644   GFRNONAA >60 07/13/2019 1644   GFRAA >60 07/13/2019 1644   Lipase     Component Value Date/Time   LIPASE 23 07/13/2019 1644       Studies/Results: Dg Ercp Biliary & Pancreatic Ducts  Result Date: 07/16/2019 CLINICAL DATA:  58 year old male with a history of colonic stent placement EXAM: COLONOSCOPY/SIGMOIDOSCOPY AND TREATMENT TECHNIQUE: Multiple spot images obtained with the fluoroscopic device and  submitted for interpretation post-procedure. FLUOROSCOPY TIME:  Fluoroscopy Time:  3 minutes 10 seconds COMPARISON:  None. FINDINGS: Limited images during sigmoidoscopy/colonoscopy demonstrates placement of a palliative colonic stent. IMPRESSION: Limited images demonstrate placement of palliative colonic stent. Please refer to the dictated operative report for full details of intraoperative findings and procedure. Electronically Signed   By: Corrie Mckusick D.O.   On: 07/16/2019 12:41    Anti-infectives: Anti-infectives (From admission, onward)   Start     Dose/Rate Route Frequency Ordered Stop   07/19/19 0600  cefoTEtan (CEFOTAN) 2 g in sodium chloride 0.9 % 100 mL IVPB     2 g 200 mL/hr over 30 Minutes Intravenous On call to O.R. 07/16/19 1022 07/20/19 0559       Assessment/Plan Colon mass near splenic flexure with resultant large and small bowel obstruction -Dr. Benson Norway able to place a stent in this lesion.  We will plan to prep him over the weekend with hopefully a one stage procedure on Monday with Dr. Marlou Starks.  -remain on clear liquids only and breeze -baseline CEA 17 -prealbumin 14.5, cont breeze - will need entereg OCTOR, bowel prep.  Will start bowel prep today.   FEN - CLD, breeze VTE - heparin ID - none   LOS: 4 days   [name], MD FACS Surgical Oncology, General Surgery, Trauma and [place] [facility],  [place] [phone] Check [email or web address], password [facility] for coverage night/weekend

## 2019-07-17 NOTE — Plan of Care (Signed)
Patient sitting edge of bed this morning; pain well controlled at this time. Has been up to bathroom and ambulating in room multiple times throughout night. No needs expressed at this time. Will continue to monitor.

## 2019-07-18 LAB — PROTIME-INR
INR: 1 (ref 0.8–1.2)
Prothrombin Time: 13.1 seconds (ref 11.4–15.2)

## 2019-07-18 LAB — HEMOGLOBIN A1C
Hgb A1c MFr Bld: 5.4 % (ref 4.8–5.6)
Mean Plasma Glucose: 108.28 mg/dL

## 2019-07-18 MED ORDER — CHLORHEXIDINE GLUCONATE CLOTH 2 % EX PADS
6.0000 | MEDICATED_PAD | Freq: Once | CUTANEOUS | Status: AC
  Administered 2019-07-19: 6 via TOPICAL

## 2019-07-18 MED ORDER — CHLORHEXIDINE GLUCONATE CLOTH 2 % EX PADS: 6.0000 | MEDICATED_PAD | Freq: Once | CUTANEOUS | Status: DC

## 2019-07-18 MED ORDER — METRONIDAZOLE 500 MG PO TABS
1000.0000 mg | ORAL_TABLET | ORAL | Status: AC
  Administered 2019-07-18 (×3): 1000 mg via ORAL
  Filled 2019-07-18 (×3): qty 2

## 2019-07-18 MED ORDER — LABETALOL HCL 5 MG/ML IV SOLN
10.0000 mg | Freq: Once | INTRAVENOUS | Status: DC
  Filled 2019-07-18: qty 4

## 2019-07-18 MED ORDER — ENSURE PRE-SURGERY PO LIQD
296.0000 mL | ORAL | Status: AC
  Administered 2019-07-18 (×2): 296 mL via ORAL
  Filled 2019-07-18 (×2): qty 296

## 2019-07-18 MED ORDER — ACETAMINOPHEN 500 MG PO TABS
1000.0000 mg | ORAL_TABLET | ORAL | Status: AC
  Administered 2019-07-19: 1000 mg via ORAL

## 2019-07-18 MED ORDER — ENSURE PRE-SURGERY PO LIQD
296.0000 mL | Freq: Once | ORAL | Status: AC
  Administered 2019-07-19: 05:00:00 296 mL via ORAL
  Filled 2019-07-18: qty 296

## 2019-07-18 MED ORDER — GABAPENTIN 300 MG PO CAPS
300.0000 mg | ORAL_CAPSULE | ORAL | Status: AC
  Administered 2019-07-19: 300 mg via ORAL
  Filled 2019-07-18: qty 1

## 2019-07-18 MED ORDER — SODIUM CHLORIDE 0.9 % IV SOLN
2.0000 g | INTRAVENOUS | Status: DC
  Filled 2019-07-18: qty 2

## 2019-07-18 MED ORDER — GABAPENTIN 300 MG PO CAPS: 300.0000 mg | ORAL_CAPSULE | ORAL | Status: DC

## 2019-07-18 MED ORDER — NEOMYCIN SULFATE 500 MG PO TABS
1000.0000 mg | ORAL_TABLET | ORAL | Status: AC
  Administered 2019-07-18 (×3): 1000 mg via ORAL
  Filled 2019-07-18 (×3): qty 2

## 2019-07-18 MED ORDER — CHLORHEXIDINE GLUCONATE CLOTH 2 % EX PADS
6.0000 | MEDICATED_PAD | Freq: Once | CUTANEOUS | Status: AC
  Administered 2019-07-18: 6 via TOPICAL

## 2019-07-18 MED ORDER — BUPIVACAINE LIPOSOME 1.3 % IJ SUSP
20.0000 mL | Freq: Once | INTRAMUSCULAR | Status: DC
  Filled 2019-07-18: qty 20

## 2019-07-18 MED ORDER — HYDRALAZINE HCL 20 MG/ML IJ SOLN
10.0000 mg | Freq: Four times a day (QID) | INTRAMUSCULAR | Status: DC | PRN
  Administered 2019-07-20: 06:00:00 10 mg via INTRAVENOUS
  Filled 2019-07-18: qty 1

## 2019-07-18 MED ORDER — ALVIMOPAN 12 MG PO CAPS: 12.0000 mg | ORAL_CAPSULE | ORAL | Status: DC

## 2019-07-18 MED ORDER — CELECOXIB 200 MG PO CAPS
200.0000 mg | ORAL_CAPSULE | ORAL | Status: AC
  Administered 2019-07-19: 200 mg via ORAL
  Filled 2019-07-18: qty 1

## 2019-07-18 MED ORDER — POLYETHYLENE GLYCOL 3350 17 GM/SCOOP PO POWD
0.5000 | Freq: Once | ORAL | Status: AC
  Administered 2019-07-18: 127.5 g via ORAL
  Filled 2019-07-18: qty 255

## 2019-07-18 NOTE — Plan of Care (Signed)
Patient sitting edge of bed this morning; states prep from yesterday is still working; clear liquid BMs overnight. Complains of crampy abdominal pain at this time; + flatus. No needs expressed at this time. Will continue to monitor.

## 2019-07-18 NOTE — Progress Notes (Signed)
Patient ID: BREYSON FIEBELKORN, male   DOB: [birth year], 58 y.o.   MRN: [id number]    3 Days Post-Op  Subjective: Started bowel prep yesterday and tolerated well.  Was having clear stool, but eventually started having brown stool again.    Objective: Vital signs in last 24 hours: Temp:  [98.5 F (36.9 C)-98.9 F (37.2 C)] 98.9 F (37.2 C) (08/23 0549) Pulse Rate:  [51-64] 59 (08/23 0549) Resp:  [17-20] 20 (08/23 0549) BP: (156-167)/(86-88) 156/88 (08/23 0549) SpO2:  [99 %-100 %] 99 % (08/23 0549) Last BM Date: 07/17/19  Intake/Output from previous day: 08/22 0701 - 08/23 0700 In: 1360 [P.O.:360; I.V.:1000] Out: 725 [Urine:725] Intake/Output this shift: No intake/output data recorded.  PE: A&O x 3. Ambulating easily. CV regular Pulm - breathing comfortably Abd: soft, ND, non tender.   Lab Results:  No results for input(s): WBC, HGB, HCT, PLT in the last 72 hours. BMET No results for input(s): NA, K, CL, CO2, GLUCOSE, BUN, CREATININE, CALCIUM in the last 72 hours. PT/INR Recent Labs    07/18/19 0434  LABPROT 13.1  INR 1.0   CMP     Component Value Date/Time   NA 138 07/13/2019 1644   K 3.3 (L) 07/13/2019 1644   CL 101 07/13/2019 1644   CO2 24 07/13/2019 1644   GLUCOSE 111 (H) 07/13/2019 1644   BUN 9 07/13/2019 1644   CREATININE 0.98 07/13/2019 1644   CALCIUM 9.6 07/13/2019 1644   PROT 8.4 (H) 07/13/2019 1644   ALBUMIN 4.6 07/13/2019 1644   AST 27 07/13/2019 1644   ALT 24 07/13/2019 1644   ALKPHOS 83 07/13/2019 1644   BILITOT 2.1 (H) 07/13/2019 1644   GFRNONAA >60 07/13/2019 1644   GFRAA >60 07/13/2019 1644   Lipase     Component Value Date/Time   LIPASE 23 07/13/2019 1644       Studies/Results: No results found.  Anti-infectives: Anti-infectives (From admission, onward)   Start     Dose/Rate Route Frequency Ordered Stop   07/19/19 0600  cefoTEtan (CEFOTAN) 2 g in sodium chloride 0.9 % 100 mL IVPB     2 g 200 mL/hr over 30 Minutes Intravenous On  call to O.R. 07/16/19 1022 07/20/19 0559   07/18/19 1400  neomycin (MYCIFRADIN) tablet 1,000 mg     1,000 mg Oral 3 times per day 07/18/19 0706 07/19/19 1359   07/18/19 1400  metroNIDAZOLE (FLAGYL) tablet 1,000 mg     1,000 mg Oral 3 times per day 07/18/19 0706 07/19/19 1359   07/18/19 0715  cefoTEtan (CEFOTAN) 2 g in sodium chloride 0.9 % 100 mL IVPB  Status:  Discontinued     2 g 200 mL/hr over 30 Minutes Intravenous On call to O.R. 07/18/19 0706 07/18/19 0726       Assessment/Plan Colon mass near splenic flexure with resultant large and small bowel obstruction -Dr. Benson Norway able to place a stent in this lesion.  We will plan to prep him over the weekend with hopefully a one stage procedure on Monday with Dr. Marlou Starks.  -remain on clear liquids only and breeze - Second half of bowel prep today.   -baseline CEA 17  -prealbumin 14.5, cont breeze -pre op orders written per ERAS protocol with entereg, cefotetan OCTOR.    FEN - CLD, breeze VTE - heparin ID - none   LOS: 5 days   [name], MD FACS Surgical Oncology, General Surgery, Trauma and [place] [facility], [place] [phone] Check [email or web address],  password [email or web address] for coverage night/weekend

## 2019-07-18 NOTE — Progress Notes (Signed)
Patient Demographics:    Billy Yates, is a 58 y.o. male, DOB - 06/28/1961, ZT:[id number]  Admit date - 07/13/2019   Admitting Physician Reubin Milan, MD  Outpatient Primary MD for the patient is Patient, No Pcp Per  LOS - 5   Chief Complaint  Patient presents with   Abdominal Pain        Subjective:    Billy Yates today has no fevers, no emesis,  No chest pain,  -Tolerating clear liquids, passing gas, tolerating colon prep-stools are still somewhat brown  Assessment  & Plan :    Active Problems:   Hypokalemia   Hyperbilirubinemia   Colonic mass  Brief summary -58 year old male with past medical history relevant for hypertension, prior history of treatment for TB and tobacco abuse admitted on 07/13/2019 with abdominal pain and concerns about colon mass causing small and large bowel obstruction Going to  OR on Monday 07/19/19 for colectomy   A/p 1)Abdominal pain and colon mass of the distal transverse colon/splenic flexure--- -CT A/P demonstrates apple core lesion in distal transverse colon just before flexure with at least partial obstruction of proximal colon. Change in bowel habits with worsening abdominal pain and a mass noted in the transverse colon on CT scan-  --flexible sigmoidoscopy with successful stent placement by Dr. Benson Norway done on 07/15/2019  -- --GI and general surgery consult appreciated -Going to  OR on Monday 07/19/19 for 1 stage procedure/colectomy with Dr. 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-Needs IV fluids while undergoing colon prep with loose stools  Code Status : Full code  Family Communication:   NA (patient is alert, awake and coherent) Discussed with patient's wife   Disposition Plan  : home  Consults  :  Gi/Gen Surgery -GI signed off 07/16/2019  DVT Prophylaxis  :    - Heparin - SCDs   Lab Results  Component Value Date   PLT 252 07/13/2019    Inpatient Medications  Scheduled Meds:  [START ON 07/19/2019] acetaminophen  1,000 mg Oral On Call to OR   [START ON 07/19/2019] alvimopan  12 mg Oral On Call to OR   [START ON 07/19/2019] bupivacaine liposome  20 mL Infiltration Once   [START ON 07/19/2019] celecoxib  200 mg Oral On Call to OR   Chlorhexidine Gluconate Cloth  6 each Topical Once   And   [START ON 07/19/2019] Chlorhexidine Gluconate Cloth  6 each Topical Once   feeding supplement  1 Container Oral TID BM   [START ON 07/19/2019] gabapentin  300 mg Oral On Call to OR   heparin injection (subcutaneous)  5,000 Units Subcutaneous Q8H   hydrocortisone  25 mg Rectal BID   neomycin  1,000 mg Oral 3 times per day   And   metroNIDAZOLE  1,000 mg Oral 3 times per day   senna-docusate  2 tablet Oral BID   sodium chloride flush  3 mL Intravenous Once   Continuous Infusions:  [START ON 07/19/2019] cefoTEtan (CEFOTAN) IV     dextrose 5 % and 0.9% NaCl 50 mL/hr at 07/17/19 2241   lactated ringers Stopped (07/15/19 1550)   PRN Meds:.acetaminophen **OR** acetaminophen, hydrALAZINE, HYDROmorphone (DILAUDID) injection, nicotine, prochlorperazine    Anti-infectives (From admission, onward)   Start     Dose/Rate Route Frequency Ordered Stop   07/19/19 0600  cefoTEtan (CEFOTAN) 2 g in sodium chloride 0.9 % 100 mL IVPB     2 g 200 mL/hr over 30 Minutes Intravenous On call to O.R. 07/16/19 1022 07/20/19 0559   07/18/19 1400  neomycin (MYCIFRADIN) tablet 1,000 mg     1,000 mg Oral 3  times per day 07/18/19 0706 07/19/19 1359   07/18/19 1400  metroNIDAZOLE (FLAGYL) tablet 1,000 mg     1,000 mg Oral 3 times per day 07/18/19 0706 07/19/19 1359   07/18/19 0715  cefoTEtan (CEFOTAN) 2 g in sodium chloride 0.9 % 100 mL IVPB  Status:  Discontinued     2 g 200 mL/hr over 30 Minutes Intravenous On call to O.R. 07/18/19 0706 07/18/19 0726        Objective:   Vitals:   07/17/19 0455 07/17/19 1305 07/17/19 2041 07/18/19 0549  BP: (!) 147/95 (!) 167/87 (!) 161/86 (!) 156/88  Pulse: 61 64 (!) 51 (!) 59  Resp: 16 17 20 20   Temp: 98.8 F (37.1 C) 98.6 F (37 C) 98.5 F (36.9 C) 98.9 F (37.2 C)  TempSrc: Oral Oral Oral Oral  SpO2: 99% 100% 99% 99%  Weight:      Height:        Wt Readings from Last 3 Encounters:  07/15/19 78.5 kg    Intake/Output Summary (Last 24 hours) at 07/18/2019 1033 Last data filed at 07/18/2019 0919 Gross per 24 hour  Intake 1400 ml  Output 200 ml  Net 1200 ml    Physical Exam  Gen:- Awake Alert,  In no apparent distress  HEENT:- .AT, No sclera icterus Neck-Supple Neck,No JVD,.  Lungs-  CTAB , fair symmetrical air movement CV- S1, S2 normal, regular  Abd-  +ve B.Sounds, Abd Soft, left upper quadrant area discomfort on palpation without rebound or guarding Extremity/Skin:- No  edema, pedal pulses present  Psych-affect is appropriate, oriented x3 Neuro-no new focal deficits, no tremors   Data Review:   Micro Results Recent Results (from the past 240 hour(s))  SARS Coronavirus 2 [facility] order, Performed in [facility] hospital lab) Nasopharyngeal Nasopharyngeal Swab     Status: None   Collection Time: 07/13/19  7:45 PM   Specimen: Nasopharyngeal Swab  Result Value Ref Range Status   SARS Coronavirus 2 NEGATIVE NEGATIVE Final    Comment: (NOTE) If result is NEGATIVE SARS-CoV-2 target nucleic acids are NOT DETECTED. The SARS-CoV-2 RNA is generally detectable in upper and lower  respiratory specimens during the acute phase of  infection. The lowest  concentration of SARS-CoV-2 viral copies this assay can detect is 250  copies / mL. A negative result does not preclude SARS-CoV-2 infection  and should not be used as the sole basis for treatment or other  patient management decisions.  A negative result may occur with  improper specimen collection / handling, submission of specimen other  than nasopharyngeal swab, presence of viral mutation(s) within the  areas targeted by this assay, and inadequate number of viral copies  (<250 copies /  mL). A negative result must be combined with clinical  observations, patient history, and epidemiological information. If result is POSITIVE SARS-CoV-2 target nucleic acids are DETECTED. The SARS-CoV-2 RNA is generally detectable in upper and lower  respiratory specimens dur ing the acute phase of infection.  Positive  results are indicative of active infection with SARS-CoV-2.  Clinical  correlation with patient history and other diagnostic information is  necessary to determine patient infection status.  Positive results do  not rule out bacterial infection or co-infection with other viruses. If result is PRESUMPTIVE POSTIVE SARS-CoV-2 nucleic acids MAY BE PRESENT.   A presumptive positive result was obtained on the submitted specimen  and confirmed on repeat testing.  While 2019 novel coronavirus  (SARS-CoV-2) nucleic acids may be present in the submitted sample  additional confirmatory testing may be necessary for epidemiological  and / or clinical management purposes  to differentiate between  SARS-CoV-2 and other Sarbecovirus currently known to infect humans.  If clinically indicated additional testing with an alternate test  methodology [phone]) is advised. The SARS-CoV-2 RNA is generally  detectable in upper and lower respiratory sp ecimens during the acute  phase of infection. The expected result is Negative. Fact Sheet for Patients:   StrictlyIdeas.no Fact Sheet for Healthcare Providers: BankingDealers.co.za This test is not yet approved or cleared by the [place] FDA and has been authorized for detection and/or diagnosis of SARS-CoV-2 by FDA under an Emergency Use Authorization (EUA).  This EUA will remain in effect (meaning this test can be used) for the duration of the COVID-19 declaration under Section 564(b)(1) of the Act, 21 U.S.C. section 360bbb-3(b)(1), unless the authorization is terminated or revoked sooner. Performed at [facility], [address] [place], [place] [address]     Radiology Reports X-ray Chest Pa And Lateral  Result Date: 07/13/2019 CLINICAL DATA:  Preop. EXAM: CHEST - 2 VIEW COMPARISON:  Lung bases from abdominal CT earlier this day. FINDINGS: The cardiomediastinal contours are normal. Trace right pleural effusion on CT not well visualized. Mild right apically pleuroparenchymal scarring. Pulmonary vasculature is normal. No consolidation or pneumothorax. No acute osseous abnormalities are seen. IMPRESSION: Trace right pleural effusion on CT not well visualized radiographically. Mild right apical pleuroparenchymal scarring. Electronically Signed   By: Keith Rake M.D.   On: 07/13/2019 20:48   Ct Abdomen Pelvis W Contrast  Result Date: 07/13/2019 CLINICAL DATA:  Increasing abdominal pain after eating with constipation, nausea and vomiting for 1 week. 10 pound weight loss in the past month. EXAM: CT ABDOMEN AND PELVIS WITH CONTRAST TECHNIQUE: Multidetector CT imaging of the abdomen and pelvis was performed using the standard protocol following bolus administration of intravenous contrast. CONTRAST:  100 mL OMNIPAQUE IOHEXOL 300 MG/ML  SOLN COMPARISON:  None. FINDINGS: Lower chest: Lung bases are clear. No pleural or pericardial effusion. Hepatobiliary: No focal liver abnormality is seen. No gallstones, gallbladder wall thickening, or  biliary dilatation. Pancreas: Unremarkable. No pancreatic ductal dilatation or surrounding inflammatory changes. Spleen: Normal in size without focal abnormality. Adrenals/Urinary Tract: Adrenal glands are unremarkable. Kidneys are normal, without renal calculi, focal lesion, or hydronephrosis. Bladder is unremarkable. Stomach/Bowel: The patient has a stricturing mass lesion with an "apple-core" configuration just proximal to the splenic flexure of the colon as seen on image 12 of series 2. The lesion results in obstruction of the ascending and transverse colon and small bowel. Mild distention of the stomach is also seen. No CT signs of bowel ischemia. The appendix  appears normal. Vascular/Lymphatic: No significant vascular findings are present. No enlarged abdominal or pelvic lymph nodes. Reproductive: Mild prostatomegaly. Other: None. Musculoskeletal: No lytic or sclerotic lesion. Multilevel lumbar facet degenerative change is noted. IMPRESSION: Mass lesion in the colon with an apple-core configuration just proximal to the splenic flexure is most consistent with carcinoma. The lesion results in obstruction of the ascending and transverse colon and small bowel. No metastatic disease identified. Mild prostatomegaly. These results were called by telephone at the time of interpretation on 07/13/2019 at 7:15 pm to Dr. Aletta Edouard , who verbally acknowledged these results. Electronically Signed   By: Inge Rise M.D.   On: 07/13/2019 19:18   Dg Ercp Biliary & Pancreatic Ducts  Result Date: 07/16/2019 CLINICAL DATA:  58 year old male with a history of colonic stent placement EXAM: COLONOSCOPY/SIGMOIDOSCOPY AND TREATMENT TECHNIQUE: Multiple spot images obtained with the fluoroscopic device and submitted for interpretation post-procedure. FLUOROSCOPY TIME:  Fluoroscopy Time:  3 minutes 10 seconds COMPARISON:  None. FINDINGS: Limited images during sigmoidoscopy/colonoscopy demonstrates placement of a palliative  colonic stent. IMPRESSION: Limited images demonstrate placement of palliative colonic stent. Please refer to the dictated operative report for full details of intraoperative findings and procedure. Electronically Signed   By: Corrie Mckusick D.O.   On: 07/16/2019 12:41    CBC Recent Labs  Lab 07/13/19 1644  WBC 9.2  HGB 16.5  HCT 48.9  PLT 252  MCV 91.7  MCH 31.0  MCHC 33.7  RDW 14.3    Chemistries  Recent Labs  Lab 07/13/19 1644  NA 138  K 3.3*  CL 101  CO2 24  GLUCOSE 111*  BUN 9  CREATININE 0.98  CALCIUM 9.6  MG 2.1  AST 27  ALT 24  ALKPHOS 83  BILITOT 2.1*  ------------------------------------------------------------------------------------------------------------------ No results for input(s): CHOL, HDL, LDLCALC, TRIG, CHOLHDL, LDLDIRECT in the last 72 hours.  Lab Results  Component Value Date   HGBA1C 5.4 07/18/2019   ------------------------------------------------------------------------------------------------------------------ No results for input(s): TSH, T4TOTAL, T3FREE, THYROIDAB in the last 72 hours.  Invalid input(s): FREET3 ------------------------------------------------------------------------------------------------------------------ No results for input(s): VITAMINB12, FOLATE, FERRITIN, TIBC, IRON, RETICCTPCT in the last 72 hours.  Coagulation profile Recent Labs  Lab 07/13/19 2200 07/18/19 0434  INR 1.1 1.0   No results for input(s): DDIMER in the last 72 hours.  Cardiac Enzymes No results for input(s): CKMB, TROPONINI, MYOGLOBIN in the last 168 hours.  Invalid input(s): CK ------------------------------------------------------------------------------------------------------------------ No results found for: BNP  Roxan Hockey M.D on 07/18/2019 at 10:33 AM  Go to [email or web address] - for contact info  [facility] - Office  [phone]

## 2019-07-19 ENCOUNTER — Encounter (HOSPITAL_COMMUNITY)
Admission: EM | Disposition: A | Discharge status: Home / Self Care | Payer: Self-pay | Source: Home / Self Care | Attending: Family Medicine

## 2019-07-19 ENCOUNTER — Encounter (HOSPITAL_COMMUNITY): Payer: Self-pay | Admitting: *Deleted

## 2019-07-19 ENCOUNTER — Inpatient Hospital Stay (HOSPITAL_COMMUNITY): Payer: BC Managed Care – PPO | Admitting: Anesthesiology

## 2019-07-19 HISTORY — PX: LAPAROSCOPIC PARTIAL COLECTOMY: SHX5907

## 2019-07-19 LAB — BASIC METABOLIC PANEL
Anion gap: 11 (ref 5–15)
BUN: <5 mg/dL — ABNORMAL LOW (ref 6–20)
CO2: 24 mmol/L (ref 22–32)
Calcium: 8.6 mg/dL — ABNORMAL LOW (ref 8.9–10.3)
Chloride: 106 mmol/L (ref 98–111)
Creatinine, Ser: 0.93 mg/dL (ref 0.61–1.24)
GFR calc Af Amer: >60 mL/min (ref >60)
GFR calc non Af Amer: >60 mL/min (ref >60)
Glucose, Bld: 82 mg/dL (ref 70–99)
Potassium: 3.5 mmol/L (ref 3.5–5.1)
Sodium: 141 mmol/L (ref 135–145)

## 2019-07-19 LAB — CBC WITH DIFFERENTIAL/PLATELET
Abs Immature Granulocytes: 0.02 10*3/uL (ref 0.00–0.07)
Basophils Absolute: 0.1 10*3/uL (ref 0.0–0.1)
Basophils Relative: 1 %
Eosinophils Absolute: 0.4 10*3/uL (ref 0.0–0.5)
Eosinophils Relative: 6 %
HCT: 40.8 % (ref 39.0–52.0)
Hemoglobin: 13.3 g/dL (ref 13.0–17.0)
Immature Granulocytes: 0 %
Lymphocytes Relative: 27 %
Lymphs Abs: 1.7 10*3/uL (ref 0.7–4.0)
MCH: 30.6 pg (ref 26.0–34.0)
MCHC: 32.6 g/dL (ref 30.0–36.0)
MCV: 93.8 fL (ref 80.0–100.0)
Monocytes Absolute: 0.9 10*3/uL (ref 0.1–1.0)
Monocytes Relative: 15 %
Neutro Abs: 3.2 10*3/uL (ref 1.7–7.7)
Neutrophils Relative %: 51 %
Platelets: 247 10*3/uL (ref 150–400)
RBC: 4.35 MIL/uL (ref 4.22–5.81)
RDW: 14.1 % (ref 11.5–15.5)
WBC: 6.3 10*3/uL (ref 4.0–10.5)
nRBC: 0 % (ref 0.0–0.2)

## 2019-07-19 SURGERY — LAPAROSCOPIC PARTIAL COLECTOMY
Anesthesia: General | Site: Abdomen

## 2019-07-19 MED ORDER — LIDOCAINE 20MG/ML (2%) 15 ML SYRINGE OPTIME
INTRAMUSCULAR | Status: DC | PRN
  Administered 2019-07-19: 1.5 mg/kg/h via INTRAVENOUS

## 2019-07-19 MED ORDER — HYDROMORPHONE HCL 1 MG/ML IJ SOLN
INTRAMUSCULAR | Status: DC | PRN
  Administered 2019-07-19 (×2): 1 mg via INTRAVENOUS

## 2019-07-19 MED ORDER — LIDOCAINE HCL 2 % IJ SOLN
INTRAMUSCULAR | Status: AC
  Filled 2019-07-19: qty 20

## 2019-07-19 MED ORDER — ONDANSETRON HCL 4 MG/2ML IJ SOLN
INTRAMUSCULAR | Status: DC | PRN
  Administered 2019-07-19: 4 mg via INTRAVENOUS

## 2019-07-19 MED ORDER — DEXAMETHASONE SODIUM PHOSPHATE 10 MG/ML IJ SOLN
INTRAMUSCULAR | Status: DC | PRN
  Administered 2019-07-19: 10 mg via INTRAVENOUS

## 2019-07-19 MED ORDER — PROPOFOL 10 MG/ML IV BOLUS
INTRAVENOUS | Status: DC | PRN
  Administered 2019-07-19: 140 mg via INTRAVENOUS

## 2019-07-19 MED ORDER — ONDANSETRON HCL 4 MG/2ML IJ SOLN
INTRAMUSCULAR | Status: AC
  Filled 2019-07-19: qty 2

## 2019-07-19 MED ORDER — LIDOCAINE HCL (CARDIAC) PF 100 MG/5ML IV SOSY
PREFILLED_SYRINGE | INTRAVENOUS | Status: DC | PRN
  Administered 2019-07-19: 80 mg via INTRAVENOUS

## 2019-07-19 MED ORDER — ACETAMINOPHEN 10 MG/ML IV SOLN: 1000.0000 mg | Freq: Once | INTRAVENOUS | Status: DC | PRN

## 2019-07-19 MED ORDER — BUPIVACAINE-EPINEPHRINE (PF) 0.25% -1:200000 IJ SOLN
INTRAMUSCULAR | Status: AC
  Filled 2019-07-19: qty 30

## 2019-07-19 MED ORDER — PROPOFOL 10 MG/ML IV BOLUS
INTRAVENOUS | Status: AC
  Filled 2019-07-19: qty 20

## 2019-07-19 MED ORDER — MORPHINE SULFATE (PF) 2 MG/ML IV SOLN
1.0000 mg | INTRAVENOUS | Status: DC | PRN
  Administered 2019-07-19: 21:00:00 2 mg via INTRAVENOUS
  Administered 2019-07-19: 18:00:00 4 mg via INTRAVENOUS
  Administered 2019-07-20: 17:00:00 2 mg via INTRAVENOUS
  Administered 2019-07-20 (×2): 4 mg via INTRAVENOUS
  Administered 2019-07-20: 15:00:00 2 mg via INTRAVENOUS
  Administered 2019-07-20: 06:00:00 4 mg via INTRAVENOUS
  Administered 2019-07-20 – 2019-07-21 (×4): 2 mg via INTRAVENOUS
  Filled 2019-07-19: qty 2
  Filled 2019-07-19 (×4): qty 1
  Filled 2019-07-19 (×2): qty 2
  Filled 2019-07-19 (×3): qty 1

## 2019-07-19 MED ORDER — PHENYLEPHRINE HCL (PRESSORS) 10 MG/ML IV SOLN
INTRAVENOUS | Status: DC | PRN
  Administered 2019-07-19 (×2): 80 ug via INTRAVENOUS

## 2019-07-19 MED ORDER — HEPARIN SODIUM (PORCINE) 5000 UNIT/ML IJ SOLN
5000.0000 [IU] | Freq: Three times a day (TID) | INTRAMUSCULAR | Status: DC
  Administered 2019-07-20 – 2019-07-22 (×8): 5000 [IU] via SUBCUTANEOUS
  Filled 2019-07-19 (×10): qty 1

## 2019-07-19 MED ORDER — ALVIMOPAN 12 MG PO CAPS
12.0000 mg | ORAL_CAPSULE | Freq: Two times a day (BID) | ORAL | Status: DC
  Administered 2019-07-20 (×2): 12 mg via ORAL
  Filled 2019-07-19 (×3): qty 1

## 2019-07-19 MED ORDER — HYDROMORPHONE HCL 1 MG/ML IJ SOLN
0.2500 mg | INTRAMUSCULAR | Status: DC | PRN
  Administered 2019-07-19 (×4): 0.5 mg via INTRAVENOUS

## 2019-07-19 MED ORDER — BUPIVACAINE-EPINEPHRINE (PF) 0.25% -1:200000 IJ SOLN
INTRAMUSCULAR | Status: DC | PRN
  Administered 2019-07-19: 14 mL

## 2019-07-19 MED ORDER — FENTANYL CITRATE (PF) 250 MCG/5ML IJ SOLN
INTRAMUSCULAR | Status: AC
  Filled 2019-07-19: qty 5

## 2019-07-19 MED ORDER — HYDROMORPHONE HCL 1 MG/ML IJ SOLN
INTRAMUSCULAR | Status: AC
  Filled 2019-07-19: qty 1

## 2019-07-19 MED ORDER — ROCURONIUM BROMIDE 10 MG/ML (PF) SYRINGE
PREFILLED_SYRINGE | INTRAVENOUS | Status: AC
  Filled 2019-07-19: qty 10

## 2019-07-19 MED ORDER — SUCCINYLCHOLINE CHLORIDE 20 MG/ML IJ SOLN
INTRAMUSCULAR | Status: DC | PRN
  Administered 2019-07-19: 100 mg via INTRAVENOUS

## 2019-07-19 MED ORDER — MIDAZOLAM HCL 2 MG/2ML IJ SOLN
INTRAMUSCULAR | Status: AC
  Filled 2019-07-19: qty 2

## 2019-07-19 MED ORDER — ROCURONIUM BROMIDE 100 MG/10ML IV SOLN
INTRAVENOUS | Status: DC | PRN
  Administered 2019-07-19: 20 mg via INTRAVENOUS
  Administered 2019-07-19: 10 mg via INTRAVENOUS
  Administered 2019-07-19: 20 mg via INTRAVENOUS
  Administered 2019-07-19: 50 mg via INTRAVENOUS
  Administered 2019-07-19: 20 mg via INTRAVENOUS

## 2019-07-19 MED ORDER — BUPIVACAINE LIPOSOME 1.3 % IJ SUSP
INTRAMUSCULAR | Status: DC | PRN
  Administered 2019-07-19: 20 mL

## 2019-07-19 MED ORDER — MIDAZOLAM HCL 5 MG/5ML IJ SOLN
INTRAMUSCULAR | Status: DC | PRN
  Administered 2019-07-19: 2 mg via INTRAVENOUS

## 2019-07-19 MED ORDER — FENTANYL CITRATE (PF) 100 MCG/2ML IJ SOLN
INTRAMUSCULAR | Status: DC | PRN
  Administered 2019-07-19: 50 ug via INTRAVENOUS
  Administered 2019-07-19 (×2): 100 ug via INTRAVENOUS

## 2019-07-19 MED ORDER — KETAMINE HCL 10 MG/ML IJ SOLN
INTRAMUSCULAR | Status: AC
  Filled 2019-07-19: qty 1

## 2019-07-19 MED ORDER — PANTOPRAZOLE SODIUM 40 MG IV SOLR
40.0000 mg | INTRAVENOUS | Status: DC
  Administered 2019-07-19 – 2019-07-20 (×2): 40 mg via INTRAVENOUS
  Filled 2019-07-19 (×2): qty 40

## 2019-07-19 MED ORDER — HYDROMORPHONE HCL 2 MG/ML IJ SOLN
INTRAMUSCULAR | Status: AC
  Filled 2019-07-19: qty 1

## 2019-07-19 MED ORDER — SUGAMMADEX SODIUM 200 MG/2ML IV SOLN
INTRAVENOUS | Status: DC | PRN
  Administered 2019-07-19: 200 mg via INTRAVENOUS

## 2019-07-19 MED ORDER — PHENYLEPHRINE 40 MCG/ML (10ML) SYRINGE FOR IV PUSH (FOR BLOOD PRESSURE SUPPORT)
PREFILLED_SYRINGE | INTRAVENOUS | Status: AC
  Filled 2019-07-19: qty 10

## 2019-07-19 MED ORDER — LIDOCAINE 2% (20 MG/ML) 5 ML SYRINGE
INTRAMUSCULAR | Status: AC
  Filled 2019-07-19: qty 5

## 2019-07-19 MED ORDER — LACTATED RINGERS IV SOLN
INTRAVENOUS | Status: DC
  Administered 2019-07-19: 12:00:00 via INTRAVENOUS

## 2019-07-19 MED ORDER — METHOCARBAMOL 1000 MG/10ML IJ SOLN
500.0000 mg | Freq: Four times a day (QID) | INTRAVENOUS | Status: DC | PRN
  Administered 2019-07-20: 02:00:00 500 mg via INTRAVENOUS
  Filled 2019-07-19: qty 5
  Filled 2019-07-19: qty 500

## 2019-07-19 MED ORDER — KCL IN DEXTROSE-NACL 20-5-0.9 MEQ/L-%-% IV SOLN
INTRAVENOUS | Status: DC
  Administered 2019-07-19 – 2019-07-20 (×2): via INTRAVENOUS
  Filled 2019-07-19 (×2): qty 1000

## 2019-07-19 MED ORDER — PROMETHAZINE HCL 25 MG/ML IJ SOLN: 6.2500 mg | INTRAMUSCULAR | Status: DC | PRN

## 2019-07-19 MED ORDER — DEXAMETHASONE SODIUM PHOSPHATE 10 MG/ML IJ SOLN
INTRAMUSCULAR | Status: AC
  Filled 2019-07-19: qty 1

## 2019-07-19 MED ORDER — 0.9 % SODIUM CHLORIDE (POUR BTL) OPTIME
TOPICAL | Status: DC | PRN
  Administered 2019-07-19: 3000 mL

## 2019-07-19 SURGICAL SUPPLY — 64 items
APPLIER CLIP 5 13 M/L LIGAMAX5 (MISCELLANEOUS) ×3
APPLIER CLIP ROT 10 11.4 M/L (STAPLE)
BLADE EXTENDED COATED 6.5IN (ELECTRODE) ×3 IMPLANT
BLADE HEX COATED 2.75 (ELECTRODE) ×3 IMPLANT
CABLE HIGH FREQUENCY MONO STRZ (ELECTRODE) ×3 IMPLANT
CELLS DAT CNTRL 66122 CELL SVR (MISCELLANEOUS) ×1 IMPLANT
CLIP APPLIE 5 13 M/L LIGAMAX5 (MISCELLANEOUS) ×1 IMPLANT
CLIP APPLIE ROT 10 11.4 M/L (STAPLE) ×1 IMPLANT
COVER WAND RF STERILE (DRAPES) IMPLANT
DECANTER SPIKE VIAL GLASS SM (MISCELLANEOUS) ×3 IMPLANT
DRAIN CHANNEL 19F RND (DRAIN) ×1 IMPLANT
DRAPE LAPAROSCOPIC ABDOMINAL (DRAPES) ×3 IMPLANT
DRSG OPSITE POSTOP 4X10 (GAUZE/BANDAGES/DRESSINGS) ×2 IMPLANT
DRSG TEGADERM 2-3/8X2-3/4 SM (GAUZE/BANDAGES/DRESSINGS) ×2 IMPLANT
ELECT REM PT RETURN 15FT ADLT (MISCELLANEOUS) ×3 IMPLANT
GAUZE SPONGE 2X2 8PLY STRL LF (GAUZE/BANDAGES/DRESSINGS) IMPLANT
GAUZE SPONGE 4X4 12PLY STRL (GAUZE/BANDAGES/DRESSINGS) ×1 IMPLANT
GLOVE ECLIPSE 8.0 STRL XLNG CF (GLOVE) ×6 IMPLANT
GLOVE INDICATOR 8.0 STRL GRN (GLOVE) ×6 IMPLANT
GOWN STRL REUS W/TWL XL LVL3 (GOWN DISPOSABLE) ×16 IMPLANT
HANDLE SUCTION POOLE (INSTRUMENTS) IMPLANT
KIT SIGMOIDOSCOPE (SET/KITS/TRAYS/PACK) IMPLANT
KIT TURNOVER KIT A (KITS) IMPLANT
LEGGING LITHOTOMY PAIR STRL (DRAPES) IMPLANT
PACK COLON (CUSTOM PROCEDURE TRAY) ×3 IMPLANT
PAD POSITIONING PINK XL (MISCELLANEOUS) ×2 IMPLANT
PAD TELFA 2X3 NADH STRL (GAUZE/BANDAGES/DRESSINGS) ×3 IMPLANT
PROTECTOR NERVE ULNAR (MISCELLANEOUS) ×2 IMPLANT
RELOAD PROXIMATE 75MM BLUE (ENDOMECHANICALS) ×6 IMPLANT
RELOAD STAPLE 75 3.8 BLU REG (ENDOMECHANICALS) IMPLANT
RETRACTOR WND ALEXIS 18 MED (MISCELLANEOUS) IMPLANT
RTRCTR WOUND ALEXIS 18CM MED (MISCELLANEOUS) ×3
SCISSORS LAP 5X35 DISP (ENDOMECHANICALS) ×3 IMPLANT
SET IRRIG TUBING LAPAROSCOPIC (IRRIGATION / IRRIGATOR) ×3 IMPLANT
SET TUBE SMOKE EVAC HIGH FLOW (TUBING) ×3 IMPLANT
SHEARS HARMONIC ACE PLUS 36CM (ENDOMECHANICALS) ×2 IMPLANT
SOLUTION ANTI FOG 6CC (MISCELLANEOUS) ×3 IMPLANT
SPONGE GAUZE 2X2 STER 10/PKG (GAUZE/BANDAGES/DRESSINGS) ×2
SPONGE LAP 18X18 X RAY DECT (DISPOSABLE) ×2 IMPLANT
STAPLER GUN LINEAR PROX 60 (STAPLE) ×2 IMPLANT
STAPLER PROXIMATE 75MM BLUE (STAPLE) ×2 IMPLANT
STAPLER VISISTAT 35W (STAPLE) ×3 IMPLANT
SUCTION POOLE HANDLE (INSTRUMENTS) ×3
SUT PDS AB 1 CTX 36 (SUTURE) ×2 IMPLANT
SUT PDS AB 1 TP1 96 (SUTURE) ×4 IMPLANT
SUT PROLENE 2 0 KS (SUTURE) IMPLANT
SUT PROLENE 2 0 SH DA (SUTURE) ×2 IMPLANT
SUT SILK 2 0 (SUTURE) ×2
SUT SILK 2 0 SH CR/8 (SUTURE) ×3 IMPLANT
SUT SILK 2-0 18XBRD TIE 12 (SUTURE) ×1 IMPLANT
SUT SILK 3 0 (SUTURE) ×2
SUT SILK 3 0 SH CR/8 (SUTURE) ×3 IMPLANT
SUT SILK 3-0 18XBRD TIE 12 (SUTURE) ×1 IMPLANT
SUT VIC AB 2-0 SH 18 (SUTURE) ×2 IMPLANT
SUT VICRYL 2 0 18  UND BR (SUTURE) ×2
SUT VICRYL 2 0 18 UND BR (SUTURE) ×2 IMPLANT
SYR 30ML LL (SYRINGE) IMPLANT
SYRINGE IRR TOOMEY STRL 70CC (SYRINGE) IMPLANT
SYS LAPSCP GELPORT 120MM (MISCELLANEOUS)
SYSTEM LAPSCP GELPORT 120MM (MISCELLANEOUS) IMPLANT
TAPE CLOTH 4X10 WHT NS (GAUZE/BANDAGES/DRESSINGS) IMPLANT
TRAY FOLEY MTR SLVR 16FR STAT (SET/KITS/TRAYS/PACK) ×3 IMPLANT
TROCAR HASSON 5MM (TROCAR) ×2 IMPLANT
YANKAUER SUCT BULB TIP NO VENT (SUCTIONS) ×3 IMPLANT

## 2019-07-19 NOTE — Anesthesia Postprocedure Evaluation (Signed)
Anesthesia Post Note  Patient: Billy Yates  Procedure(s) Performed: LAPAROSCOPIC ASSISTED RESECTION OF SPLENIC FLEXURE COLON, MOBOLIZATION OF SPLENIC FLEXURE (N/A Abdomen)     Patient location during evaluation: PACU Anesthesia Type: General Level of consciousness: awake and alert and oriented Pain management: pain level controlled Vital Signs Assessment: post-procedure vital signs reviewed and stable Respiratory status: spontaneous breathing, nonlabored ventilation and respiratory function stable Cardiovascular status: blood pressure returned to baseline Postop Assessment: no apparent nausea or vomiting Anesthetic complications: no    Last Vitals:  Vitals:   07/19/19 1205 07/19/19 1549  BP: (!) 149/97 (!) 162/79  Pulse: 63 67  Resp: 18 (!) 9  Temp: 37.2 C 36.8 C  SpO2: 100% 100%    Last Pain:  Vitals:   07/19/19 1549  TempSrc:   PainSc: [place]

## 2019-07-19 NOTE — Anesthesia Preprocedure Evaluation (Addendum)
Anesthesia Evaluation  Patient identified by MRN, date of birth, ID band Patient awake    Reviewed: Allergy & Precautions, NPO status , Patient's Chart, lab work & pertinent test results  History of Anesthesia Complications Negative for: history of anesthetic complications  Airway Mallampati: II  TM Distance: >3 FB Neck ROM: Full    Dental no notable dental hx.    Pulmonary Current Smoker,  Hx of TB s/p treatment in 2017   Pulmonary exam normal        Cardiovascular hypertension, Normal cardiovascular exam     Neuro/Psych negative neurological ROS     GI/Hepatic Neg liver ROS, Obstructing colon mass   Endo/Other  negative endocrine ROS  Renal/GU negative Renal ROS     Musculoskeletal negative musculoskeletal ROS (+)   Abdominal   Peds  Hematology negative hematology ROS (+)   Anesthesia Other Findings Day of surgery medications reviewed with the patient.  Reproductive/Obstetrics                           Anesthesia Physical Anesthesia Plan  ASA: III  Anesthesia Plan: General   Post-op Pain Management:    Induction: Intravenous  PONV Risk Score and Plan: 3 and Treatment may vary due to age or medical condition, Ondansetron, Dexamethasone and Midazolam  Airway Management Planned: Oral ETT  Additional Equipment: None  Intra-op Plan:   Post-operative Plan: Extubation in OR  Informed Consent: I have reviewed the patients History and Physical, chart, labs and discussed the procedure including the risks, benefits and alternatives for the proposed anesthesia with the patient or authorized representative who has indicated his/her understanding and acceptance.     Dental advisory given  Plan Discussed with: CRNA  Anesthesia Plan Comments:        Anesthesia Quick Evaluation

## 2019-07-19 NOTE — Anesthesia Procedure Notes (Signed)
Procedure Name: Intubation Date/Time: 07/19/2019 1:00 PM Performed by: Brennan Bailey, MD Pre-anesthesia Checklist: Patient identified, Emergency Drugs available, Suction available and Patient being monitored Patient Re-evaluated:Patient Re-evaluated prior to induction Oxygen Delivery Method: Circle System Utilized Preoxygenation: Pre-oxygenation with 100% oxygen Induction Type: IV induction Ventilation: Mask ventilation without difficulty Laryngoscope Size: Miller and 2 Tube type: Oral Tube size: 7.5 mm Number of attempts: 2 Airway Equipment and Method: Stylet Placement Confirmation: ETT inserted through vocal cords under direct vision,  positive ETCO2 and breath sounds checked- equal and bilateral Secured at: 23 cm Tube secured with: Tape Dental Injury: Teeth and Oropharynx as per pre-operative assessment  Difficulty Due To: Difficult Airway- due to anterior larynx and Difficulty was unanticipated Comments: First attempt by paramedic student with Sabra Heck 2 blade, unable to view glottis. Second attempt by myself with grade 2B view, anterior airway noted (with optimal positioning and cricoid pressure). Atraumatic intubation. Daiva Huge, MD

## 2019-07-19 NOTE — Progress Notes (Signed)
4 Days Post-Op   Subjective/Chief Complaint: No complaints   Objective: Vital signs in last 24 hours: Temp:  [98.7 F (37.1 C)-98.9 F (37.2 C)] 98.7 F (37.1 C) (08/24 0524) Pulse Rate:  [53-64] 60 (08/24 0524) Resp:  [16-18] 16 (08/24 0524) BP: (142-170)/(72-95) 147/72 (08/24 0524) SpO2:  [96 %-99 %] 98 % (08/24 0524) Last BM Date: 07/18/19  Intake/Output from previous day: 08/23 0701 - 08/24 0700 In: 2456 [P.O.:1256; I.V.:1200] Out: -  Intake/Output this shift: No intake/output data recorded.  General appearance: alert and cooperative Resp: clear to auscultation bilaterally Cardio: regular rate and rhythm GI: Soft and nontender  Lab Results:  Recent Labs    07/19/19 0354  WBC 6.3  HGB 13.3  HCT 40.8  PLT 247   BMET Recent Labs    07/19/19 0354  NA 141  K 3.5  CL 106  CO2 24  GLUCOSE 82  BUN <5*  CREATININE 0.93  CALCIUM 8.6*   PT/INR Recent Labs    07/18/19 0434  LABPROT 13.1  INR 1.0   ABG No results for input(s): PHART, HCO3 in the last 72 hours.  Invalid input(s): PCO2, PO2  Studies/Results: No results found.  Anti-infectives: Anti-infectives (From admission, onward)   Start     Dose/Rate Route Frequency Ordered Stop   07/19/19 0600  cefoTEtan (CEFOTAN) 2 g in sodium chloride 0.9 % 100 mL IVPB     2 g 200 mL/hr over 30 Minutes Intravenous On call to O.R. 07/16/19 1022 07/20/19 0559   07/18/19 1400  neomycin (MYCIFRADIN) tablet 1,000 mg     1,000 mg Oral 3 times per day 07/18/19 0706 07/18/19 2121   07/18/19 1400  metroNIDAZOLE (FLAGYL) tablet 1,000 mg     1,000 mg Oral 3 times per day 07/18/19 0706 07/18/19 2120   07/18/19 0715  cefoTEtan (CEFOTAN) 2 g in sodium chloride 0.9 % 100 mL IVPB  Status:  Discontinued     2 g 200 mL/hr over 30 Minutes Intravenous On call to O.R. 07/18/19 [id number] 07/18/19 0726      Assessment/Plan: s/p Procedure(s): FLEXIBLE SIGMOIDOSCOPY (Left) COLONIC STENT PLACEMENT (N/A) Plan for  laparoscopic-assisted partial colectomy today.  The risks and benefits of the surgery as well as some of the technical aspects including the risk of leak were discussed with the patient today and he understands and wishes to proceed  LOS: 6 days    Autumn Messing III 07/19/2019

## 2019-07-19 NOTE — Op Note (Signed)
07/13/2019 - 07/19/2019  3:32 PM  PATIENT:  Billy Yates  58 y.o. male  PRE-OPERATIVE DIAGNOSIS:  OBSTRUCTION COLON MASS  POST-OPERATIVE DIAGNOSIS:  OBSTRUCTION COLON MASS AT SPLENIC FLEXURE  PROCEDURE:  Procedure(s): LAPAROSCOPIC ASSISTED RESECTION OF SPLENIC FLEXURE COLON, MOBOLIZATION OF SPLENIC FLEXURE (N/A)  SURGEON:  Surgeon(s) and Role:    * Jovita Kussmaul, MD - Primary  PHYSICIAN ASSISTANT:   ASSISTANTS: Modena Jansky, PA   ANESTHESIA:   local and general  EBL:  25 mL   BLOOD ADMINISTERED:none  DRAINS: none   LOCAL MEDICATIONS USED:  OTHER exparel  SPECIMEN:  Source of Specimen:  splenic flexure of colon with obstructing mass  DISPOSITION OF SPECIMEN:  PATHOLOGY  COUNTS:  YES  TOURNIQUET:  * No tourniquets in log *  DICTATION: .Dragon Dictation   After informed consent was obtained the patient was brought to the operating room and placed in the supine position on the operating table.  After adequate induction of general anesthesia the patient was moved into lithotomy position and all pressure points were padded.  The abdomen was then prepped with ChloraPrep, allowed to dry, and draped in usual sterile manner.  The perineum was prepped with Betadine.  A site was chosen along the midline of the abdomen just above the umbilicus for access in the abdominal cavity.  This area was infiltrated with quarter percent Marcaine.  A small vertically oriented stab incision was made with a 15 blade knife.  A 5 mm Optiview port and camera were used to bluntly dissected the layers of the abdominal wall under direct vision until access was gained to the abdominal cavity.  The abdomen was then insufflated with carbon dioxide without difficulty.  Another 5 mm port was placed under direct vision in the right upper quadrant and a third port in the left lower quadrant also under direct vision.  The left colon was then mobilized by incising its retroperitoneal attachment along the white  line of Toldt.  The splenic flexure was tethered in the left upper quadrant and so at this point I made an upper midline incision with a 10 blade knife.  The incision was carried through the skin and subcutaneous tissue sharply with the electrocautery.  The linea alba was also incised with the electrocautery.  The preperitoneal space was probed bluntly with a hemostat until the peritoneum was opened and access was gained to the abdominal cavity.  The rest of the incision was opened under direct vision.  I was then able to identify the area of the tumor at the splenic flexure.  The rest of the splenic flexure was then mobilized so that the colon could be brought up into the wound.  The lesser sac was then opened along the mid transverse colon.  I was then able to separate the distal transverse colon from the stomach by incising the gastrohepatic ligament with the harmonic scalpel.  Next I chose a site well above and below the area of the obstructing mass where there was good blood supply to the transverse and left colon.  The mesentery at each of the sites was opened sharply with the electrocautery.  A GIA-75 stapler was then placed at across each area of colon, clamped, and fired thereby dividing the colon between staple lines.  The mesentery to the [facility] splenic flexure was then taken down sharply with the harmonic scalpel.  The larger vessels were clamped with Kelly clamps, divided with the harmonic scalpel and then ligated with 2-0 silk  ties.  Once this was accomplished the splenic flexure was removed from the patient.  It was marked with a stitch on the proximal staple line.  The proximal and distal segments of colon reached each other very easily with no tension and appeared to have very good blood supply.  These 2 segments of colon were held in close approximation with 2-0 silk stitches.  A small opening was then made on the antimesenteric edge of each limb of colon.  Each limb of a GIA-75 stapler was then  placed down the appropriate limb of colon, clamped, and fired thereby creating a nice widely patent enteroenterostomy.  The common opening was then closed with a single firing of a TA 60 stapler.  The staple line was then reinforced with multiple 2-0 silk Lembert stitches and a 2-0 silk crotch stitch.  The edge of the mesenteric defect contained a large vessel feeding the distal segment so I elected not to try to close the mesenteric defect.  The anastomosis was placed in the left upper quadrant.  The abdomen was then irrigated with copious amounts of saline.  The anastomosis was examined again and appeared to be healthy and pink with no tension.  At this point all drapes gowns and gloves were changed.  The abdomen is then irrigated again with copious amounts of saline.  The fascia of the anterior abdominal wall was closed with 2 running #1 double-stranded looped PDS sutures.  The subcutaneous tissue was infiltrated with Exparel and irrigated with saline.  The incisions were all closed with staples.  Sterile dressings were applied.  The patient tolerated the procedure well.  At the end of the case all needle sponge and instrument counts were correct.  The patient was then awakened and taken recovery in stable condition.  PLAN OF CARE: Admit to inpatient   PATIENT DISPOSITION:  PACU - hemodynamically stable.   Delay start of Pharmacological VTE agent (>24hrs) due to surgical blood loss or risk of bleeding: no

## 2019-07-19 NOTE — Transfer of Care (Signed)
Immediate Anesthesia Transfer of Care Note  Patient: Billy Yates  Procedure(s) Performed: LAPAROSCOPIC ASSISTED RESECTION OF SPLENIC FLEXURE COLON, MOBOLIZATION OF SPLENIC FLEXURE (N/A Abdomen)  Patient Location: PACU  Anesthesia Type:General  Level of Consciousness: drowsy, patient cooperative and responds to stimulation  Airway & Oxygen Therapy: Patient Spontanous Breathing and Patient connected to face mask oxygen  Post-op Assessment: Report given to RN and Post -op Vital signs reviewed and stable  Post vital signs: Reviewed and stable  Last Vitals:  Vitals Value Taken Time  BP 162/79 07/19/19 1549  Temp    Pulse 64 07/19/19 1552  Resp 13 07/19/19 1552  SpO2 100 % 07/19/19 1552  Vitals shown include unvalidated device data.  Last Pain:  Vitals:   07/19/19 1210  TempSrc:   PainSc: 5       Patients Stated Pain Goal: 4 ([id number])  Complications: No apparent anesthesia complications

## 2019-07-19 NOTE — Progress Notes (Signed)
PROGRESS NOTE    Billy Yates  [id number] DOB: Jun 21, 1961 DOA: Jul 19, 2019 PCP: Patient, No Pcp Per      Brief Narrative:  Billy Yates is a 58 y.o. M with HTN who presented with abdominal pain.    In the ER, CT showed a circumferential colon mass and General Surgery recommended resection.    Assessment & Plan:  Colon mass Went to OR today for lap hemicolectomy with primary anastomosis.  Uncomplicated. -Per Surgery  Hypertension Slightly elevated.  Not on home medications. -PRN hydralazine for severe range pressures      MDM and disposition: The below labs and imaging reports were reviewed and summarized above.  Medication management as above.  The patient was admitted with colon mass.  He is now post-op from partial colectomy.        DVT prophylaxis: Heparin Code Status: FULL Family Communication: Wife at bedside    Consultants:   General Surgery  GI  Procedures:   EGD  Hemicolectomy    Subjective: Tired post-op.  Having spasms.  No confusion, no fever.    Objective: Vitals:   07/19/19 1715 07/19/19 1741 07/19/19 1841 07/19/19 1950  BP:  (!) 160/97 (!) 145/78 (!) 149/88  Pulse:  66 62 (!) 59  Resp:  19 18 15   Temp: 98 F (36.7 C) 98.4 F (36.9 C) 97.8 F (36.6 C) 98.7 F (37.1 C)  TempSrc:  Oral Oral Oral  SpO2:  100% 100% 100%  Weight:      Height:        Intake/Output Summary (Last 24 hours) at 07/19/2019 2008 Last data filed at 07/19/2019 1847 Gross per 24 hour  Intake 3010.83 ml  Output 1150 ml  Net 1860.83 ml   Filed Weights   07-19-2019 1615 07/15/19 1358 07/19/19 1210  Weight: 78.5 kg 78.5 kg 78.5 kg    Examination: General appearance:  adult male, alert and in no acute distress.  Tired. Cardiac: RRR, nl S1-S2, no murmurs appreciated.   Respiratory: Normal respiratory rate and rhythm.  CTAB without rales or wheezes.    Data Reviewed: I have personally reviewed following labs and imaging studies:  CBC: Recent  Labs  Lab 2019/07/19 1644 07/19/19 0354  WBC 9.2 6.3  NEUTROABS  --  3.2  HGB 16.5 13.3  HCT 48.9 40.8  MCV 91.7 93.8  PLT 252 [id number]   Basic Metabolic Panel: Recent Labs  Lab 2019/07/19 1644 07/19/19 0354  NA 138 141  K 3.3* 3.5  CL 101 106  CO2 24 24  GLUCOSE 111* 82  BUN 9 <5*  CREATININE 0.98 0.93  CALCIUM 9.6 8.6*  MG 2.1  --   PHOS 3.5  --    GFR: Estimated Creatinine Clearance: 97.3 mL/min (by C-G formula based on SCr of 0.93 mg/dL). Liver Function Tests: Recent Labs  Lab 19-Jul-2019 1644  AST 27  ALT 24  ALKPHOS 83  BILITOT 2.1*  PROT 8.4*  ALBUMIN 4.6   Recent Labs  Lab 07-19-2019 1644  LIPASE 23   No results for input(s): AMMONIA in the last 168 hours. Coagulation Profile: Recent Labs  Lab 19-Jul-2019 2200 07/18/19 0434  INR 1.1 1.0   Cardiac Enzymes: No results for input(s): CKTOTAL, CKMB, CKMBINDEX, TROPONINI in the last 168 hours. BNP (last 3 results) No results for input(s): PROBNP in the last 8760 hours. HbA1C: Recent Labs    07/18/19 0434  HGBA1C 5.4   CBG: No results for input(s): GLUCAP in the last 168 hours. Lipid  Profile: No results for input(s): CHOL, HDL, LDLCALC, TRIG, CHOLHDL, LDLDIRECT in the last 72 hours. Thyroid Function Tests: No results for input(s): TSH, T4TOTAL, FREET4, T3FREE, THYROIDAB in the last 72 hours. Anemia Panel: No results for input(s): VITAMINB12, FOLATE, FERRITIN, TIBC, IRON, RETICCTPCT in the last 72 hours. Urine analysis:    Component Value Date/Time   COLORURINE AMBER (A) 07/13/2019 [place] 07/13/2019 1644   LABSPEC >1.030 (H) 07/13/2019 1644   PHURINE 6.0 07/13/2019 1644   GLUCOSEU NEGATIVE 07/13/2019 1644   HGBUR MODERATE (A) 07/13/2019 1644   BILIRUBINUR MODERATE (A) 07/13/2019 1644   KETONESUR 15 (A) 07/13/2019 1644   PROTEINUR 30 (A) 07/13/2019 1644   NITRITE NEGATIVE 07/13/2019 1644   LEUKOCYTESUR NEGATIVE 07/13/2019 1644   Sepsis Labs:  @[email or web address] (procalcitonin:4,lacticacidven:4)  ) Recent Results (from the past 240 hour(s))  SARS Coronavirus 2 [facility] order, Performed in [facility] hospital lab) Nasopharyngeal Nasopharyngeal Swab     Status: None   Collection Time: 07/13/19  7:45 PM   Specimen: Nasopharyngeal Swab  Result Value Ref Range Status   SARS Coronavirus 2 NEGATIVE NEGATIVE Final    Comment: (NOTE) If result is NEGATIVE SARS-CoV-2 target nucleic acids are NOT DETECTED. The SARS-CoV-2 RNA is generally detectable in upper and lower  respiratory specimens during the acute phase of infection. The lowest  concentration of SARS-CoV-2 viral copies this assay can detect is 250  copies / mL. A negative result does not preclude SARS-CoV-2 infection  and should not be used as the sole basis for treatment or other  patient management decisions.  A negative result may occur with  improper specimen collection / handling, submission of specimen other  than nasopharyngeal swab, presence of viral mutation(s) within the  areas targeted by this assay, and inadequate number of viral copies  (<250 copies / mL). A negative result must be combined with clinical  observations, patient history, and epidemiological information. If result is POSITIVE SARS-CoV-2 target nucleic acids are DETECTED. The SARS-CoV-2 RNA is generally detectable in upper and lower  respiratory specimens dur ing the acute phase of infection.  Positive  results are indicative of active infection with SARS-CoV-2.  Clinical  correlation with patient history and other diagnostic information is  necessary to determine patient infection status.  Positive results do  not rule out bacterial infection or co-infection with other viruses. If result is PRESUMPTIVE POSTIVE SARS-CoV-2 nucleic acids MAY BE PRESENT.   A presumptive positive result was obtained on the submitted specimen  and confirmed on repeat testing.  While 2019 novel coronavirus  (SARS-CoV-2)  nucleic acids may be present in the submitted sample  additional confirmatory testing may be necessary for epidemiological  and / or clinical management purposes  to differentiate between  SARS-CoV-2 and other Sarbecovirus currently known to infect humans.  If clinically indicated additional testing with an alternate test  methodology [phone]) is advised. The SARS-CoV-2 RNA is generally  detectable in upper and lower respiratory sp ecimens during the acute  phase of infection. The expected result is Negative. Fact Sheet for Patients:  StrictlyIdeas.no Fact Sheet for Healthcare Providers: BankingDealers.co.za This test is not yet approved or cleared by the [place] FDA and has been authorized for detection and/or diagnosis of SARS-CoV-2 by FDA under an Emergency Use Authorization (EUA).  This EUA will remain in effect (meaning this test can be used) for the duration of the COVID-19 declaration under Section 564(b)(1) of the Act, 21 U.S.C. section 360bbb-3(b)(1), unless the authorization is  terminated or revoked sooner. Performed at [facility], [address], [place], [place] [address]          Radiology Studies: No results found.      Scheduled Meds: . [START ON 07/20/2019] alvimopan  12 mg Oral BID  . [START ON 07/20/2019] heparin injection (subcutaneous)  5,000 Units Subcutaneous Q8H  . HYDROmorphone      . HYDROmorphone      . pantoprazole (PROTONIX) IV  40 mg Intravenous Q24H  . sodium chloride flush  3 mL Intravenous Once   Continuous Infusions: . dextrose 5 % and 0.9 % NaCl with KCl 20 mEq/L 75 mL/hr at 07/19/19 1953  . dextrose 5 % and 0.9% NaCl 50 mL/hr at 07/19/19 1801  . methocarbamol (ROBAXIN) IV       LOS: 6 days    Time spent: 14 minutes    Edwin Dada, MD [facility] 07/19/2019, 8:08 PM     Please page through [place]:  [email or web address] Password [email or web address] If 7PM-7AM,  please contact night-coverage

## 2019-07-20 ENCOUNTER — Encounter (HOSPITAL_COMMUNITY): Payer: Self-pay | Admitting: General Surgery

## 2019-07-20 MED ORDER — ACETAMINOPHEN 500 MG PO TABS
1000.0000 mg | ORAL_TABLET | Freq: Three times a day (TID) | ORAL | Status: DC
  Administered 2019-07-20 – 2019-07-23 (×9): 1000 mg via ORAL
  Filled 2019-07-20 (×9): qty 2

## 2019-07-20 MED ORDER — KCL IN DEXTROSE-NACL 40-5-0.9 MEQ/L-%-% IV SOLN
INTRAVENOUS | Status: DC
  Administered 2019-07-20 – 2019-07-22 (×2): via INTRAVENOUS
  Filled 2019-07-20 (×3): qty 1000

## 2019-07-20 MED ORDER — KETOROLAC TROMETHAMINE 30 MG/ML IJ SOLN
15.0000 mg | Freq: Four times a day (QID) | INTRAMUSCULAR | Status: AC
  Administered 2019-07-20 – 2019-07-23 (×12): 15 mg via INTRAVENOUS
  Filled 2019-07-20 (×12): qty 1

## 2019-07-20 MED ORDER — KCL IN DEXTROSE-NACL 20-5-0.9 MEQ/L-%-% IV SOLN
INTRAVENOUS | Status: AC
  Filled 2019-07-20: qty 1000

## 2019-07-20 MED ORDER — METHOCARBAMOL 1000 MG/10ML IJ SOLN
1000.0000 mg | Freq: Three times a day (TID) | INTRAVENOUS | Status: DC
  Administered 2019-07-20 – 2019-07-23 (×9): 1000 mg via INTRAVENOUS
  Filled 2019-07-20 (×10): qty 10

## 2019-07-20 MED ORDER — AMLODIPINE BESYLATE 5 MG PO TABS
5.0000 mg | ORAL_TABLET | Freq: Every day | ORAL | Status: DC
  Administered 2019-07-21 – 2019-07-22 (×2): 5 mg via ORAL
  Filled 2019-07-20 (×2): qty 1

## 2019-07-20 NOTE — Progress Notes (Signed)
PROGRESS NOTE    KAP MILTIMORE  [id number] DOB: [birth year] DOA: 07/13/2019 PCP: Patient, No Pcp Per      Brief Narrative:  [name] is a 58 y.o. M with HTN who presented with abdominal pain.    In the ER, CT showed a circumferential colon mass and General Surgery recommended resection.    Assessment & Plan:  Colon mass Went to OR 8/24 for lap hemicolectomy with primary anastomosis.  Uncomplicated. -Per Surgery   Hypertension BP remains elevated.   Not on home meds.  -Start amlodipine tomorrow -PRN hydralazine for severe range pressures      MDM and disposition: The below labs and imaging reports reviewed and summarized above.  Medication management as above.  The patient was admitted with a colon mass, he is undergone partial colectomy on 8/24.  His postoperative course, timing of advancement of diet, and timing of discharge will be determined by surgery.  In the meantime we will manage his blood pressure as above.       DVT prophylaxis: Heparin Code Status: FULL Family Communication: Wife at bedside    Consultants:   General Surgery  GI  Procedures:   EGD  Hemicolectomy    Subjective: Patient having abdominal spasms from time to time.  No fever, no confusion, no vomiting, no flatus, no bowel movement.  Objective: Vitals:   07/20/19 0139 07/20/19 0543 07/20/19 0631 07/20/19 0951  BP: (!) 169/88 (!) 177/86 (!) 160/74 (!) 155/94  Pulse: 66  78 83  Resp: 16  16 18   Temp: 99.1 F (37.3 C)  98.9 F (37.2 C) 99 F (37.2 C)  TempSrc: Oral  Oral Oral  SpO2: 100%  100% 100%  Weight:      Height:        Intake/Output Summary (Last 24 hours) at 07/20/2019 1150 Last data filed at 07/20/2019 1000 Gross per 24 hour  Intake 3770.24 ml  Output 2700 ml  Net 1070.24 ml   Filed Weights   07/13/19 1615 07/15/19 1358 07/19/19 1210  Weight: 78.5 kg 78.5 kg 78.5 kg    Examination:   General: Adult male, lying in bed, appears tired.   Responds appropriately to questions.  Eye contact, dress and hygiene appropriate. HEENT: Corneas clear, conjunctivae and sclerae normal without injection or icterus, lids and lashes normal.  Visual tracking smooth.    Cardiac: RRR, nl S1-S2, no murmurs, rubs, gallops.   JVP normal, no lower extremity edema. Respiratory: Normal respiratory rate and rhythm without rales or wheezes. Neuro: Sensorium intact.  Speech is fluent.  Naming is grossly intact, and the patient's recall, recent and remote, as well as general fund of knowledge seem within normal limits.  Muscle tone normal, without fasciculations.  Moves all extremities equally and with normal coordination.  Attention span and concentration are within normal limits.  Psych:      Data Reviewed: I have personally reviewed following labs and imaging studies:  CBC: Recent Labs  Lab 07/13/19 1644 07/19/19 0354  WBC 9.2 6.3  NEUTROABS  --  3.2  HGB 16.5 13.3  HCT 48.9 40.8  MCV 91.7 93.8  PLT 252 [id number]   Basic Metabolic Panel: Recent Labs  Lab 07/13/19 1644 07/19/19 0354  NA 138 141  K 3.3* 3.5  CL 101 106  CO2 24 24  GLUCOSE 111* 82  BUN 9 <5*  CREATININE 0.98 0.93  CALCIUM 9.6 8.6*  MG 2.1  --   PHOS 3.5  --    GFR:  Estimated Creatinine Clearance: 97.3 mL/min (by C-G formula based on SCr of 0.93 mg/dL). Liver Function Tests: Recent Labs  Lab 07/13/19 1644  AST 27  ALT 24  ALKPHOS 83  BILITOT 2.1*  PROT 8.4*  ALBUMIN 4.6   Recent Labs  Lab 07/13/19 1644  LIPASE 23   No results for input(s): AMMONIA in the last 168 hours. Coagulation Profile: Recent Labs  Lab 07/13/19 2200 07/18/19 0434  INR 1.1 1.0   Cardiac Enzymes: No results for input(s): CKTOTAL, CKMB, CKMBINDEX, TROPONINI in the last 168 hours. BNP (last 3 results) No results for input(s): PROBNP in the last 8760 hours. HbA1C: Recent Labs    07/18/19 0434  HGBA1C 5.4   CBG: No results for input(s): GLUCAP in the last 168 hours. Lipid  Profile: No results for input(s): CHOL, HDL, LDLCALC, TRIG, CHOLHDL, LDLDIRECT in the last 72 hours. Thyroid Function Tests: No results for input(s): TSH, T4TOTAL, FREET4, T3FREE, THYROIDAB in the last 72 hours. Anemia Panel: No results for input(s): VITAMINB12, FOLATE, FERRITIN, TIBC, IRON, RETICCTPCT in the last 72 hours. Urine analysis:    Component Value Date/Time   COLORURINE AMBER (A) 07/13/2019 1644   APPEARANCEUR CLEAR 07/13/2019 1644   LABSPEC >1.030 (H) 07/13/2019 1644   PHURINE 6.0 07/13/2019 1644   GLUCOSEU NEGATIVE 07/13/2019 1644   HGBUR MODERATE (A) 07/13/2019 1644   BILIRUBINUR MODERATE (A) 07/13/2019 1644   KETONESUR 15 (A) 07/13/2019 1644   PROTEINUR 30 (A) 07/13/2019 1644   NITRITE NEGATIVE 07/13/2019 1644   LEUKOCYTESUR NEGATIVE 07/13/2019 1644   Sepsis Labs: @[email or web address] (procalcitonin:4,lacticacidven:4)  ) Recent Results (from the past 240 hour(s))  SARS Coronavirus 2 [facility] order, Performed in [facility] hospital lab) Nasopharyngeal Nasopharyngeal Swab     Status: None   Collection Time: 07/13/19  7:45 PM   Specimen: Nasopharyngeal Swab  Result Value Ref Range Status   SARS Coronavirus 2 NEGATIVE NEGATIVE Final    Comment: (NOTE) If result is NEGATIVE SARS-CoV-2 target nucleic acids are NOT DETECTED. The SARS-CoV-2 RNA is generally detectable in upper and lower  respiratory specimens during the acute phase of infection. The lowest  concentration of SARS-CoV-2 viral copies this assay can detect is 250  copies / mL. A negative result does not preclude SARS-CoV-2 infection  and should not be used as the sole basis for treatment or other  patient management decisions.  A negative result may occur with  improper specimen collection / handling, submission of specimen other  than nasopharyngeal swab, presence of viral mutation(s) within the  areas targeted by this assay, and inadequate number of viral copies  (<250 copies / mL). A negative result must be  combined with clinical  observations, patient history, and epidemiological information. If result is POSITIVE SARS-CoV-2 target nucleic acids are DETECTED. The SARS-CoV-2 RNA is generally detectable in upper and lower  respiratory specimens dur ing the acute phase of infection.  Positive  results are indicative of active infection with SARS-CoV-2.  Clinical  correlation with patient history and other diagnostic information is  necessary to determine patient infection status.  Positive results do  not rule out bacterial infection or co-infection with other viruses. If result is PRESUMPTIVE POSTIVE SARS-CoV-2 nucleic acids MAY BE PRESENT.   A presumptive positive result was obtained on the submitted specimen  and confirmed on repeat testing.  While 2019 novel coronavirus  (SARS-CoV-2) nucleic acids may be present in the submitted sample  additional confirmatory testing may be necessary for epidemiological  and / or clinical  management purposes  to differentiate between  SARS-CoV-2 and other Sarbecovirus currently known to infect humans.  If clinically indicated additional testing with an alternate test  methodology [phone]) is advised. The SARS-CoV-2 RNA is generally  detectable in upper and lower respiratory sp ecimens during the acute  phase of infection. The expected result is Negative. Fact Sheet for Patients:  StrictlyIdeas.no Fact Sheet for Healthcare Providers: BankingDealers.co.za This test is not yet approved or cleared by the [place] FDA and has been authorized for detection and/or diagnosis of SARS-CoV-2 by FDA under an Emergency Use Authorization (EUA).  This EUA will remain in effect (meaning this test can be used) for the duration of the COVID-19 declaration under Section 564(b)(1) of the Act, 21 U.S.C. section 360bbb-3(b)(1), unless the authorization is terminated or revoked sooner. Performed at [facility], [address] [place], [place] [address]          Radiology Studies: No results found.      Scheduled Meds: . acetaminophen  1,000 mg Oral Q8H  . alvimopan  12 mg Oral BID  . [START ON 07/21/2019] amLODipine  5 mg Oral Daily  . heparin injection (subcutaneous)  5,000 Units Subcutaneous Q8H  . ketorolac  15 mg Intravenous Q6H  . pantoprazole (PROTONIX) IV  40 mg Intravenous Q24H  . sodium chloride flush  3 mL Intravenous Once   Continuous Infusions: . dextrose 5 % and 0.9 % NaCl with KCl 20 mEq/L    . dextrose 5 % and 0.9 % NaCl with KCl 40 mEq/L    . dextrose 5 % and 0.9% NaCl 50 mL/hr at 07/19/19 1801  . methocarbamol (ROBAXIN) IV       LOS: 7 days    Time spent: 15 minutes    Edwin Dada, MD [facility] 07/20/2019, 11:50 AM     Please page through [place]:  [email or web address] Password [email or web address] If 7PM-7AM, please contact night-coverage

## 2019-07-20 NOTE — Progress Notes (Signed)
1 Day Post-Op    [id number] pain  Subjective: He is pretty sore and uncomfortable this AM.  Foley in.  BP up also.  Site looks fine some blood on dressing.  Honeycomb dressing in place.  Tolerating clears, no flatus.  He was not using IS but I showed him how and he can move between [phone] at most right now.    Objective: Vital signs in last 24 hours: Temp:  [97.8 F (36.6 C)-99.1 F (37.3 C)] 99 F (37.2 C) (08/25 0951) Pulse Rate:  [59-83] 83 (08/25 0951) Resp:  [9-27] 18 (08/25 0951) BP: (145-177)/(74-97) 155/94 (08/25 0951) SpO2:  [100 %] 100 % (08/25 0951) Weight:  [78.5 kg] 78.5 kg (08/24 1210) Last BM Date: 07/18/19 150 p.o. 2900 IV 2275 urine Afebrile vital signs are stable BP slightly elevated A.m. labs are stable K+ 3.5 No films Intake/Output from previous day: 08/24 0701 - 08/25 0700 In: 3110.3 [P.O.:150; I.V.:2810.3; IV Piggyback:149.9] Out: 2300 [Urine:2275; Blood:25] Intake/Output this shift: Total I/O In: 660 [P.O.:360; I.V.:300] Out: 700 [Urine:700]  General appearance: alert, cooperative, no distress and very sore, wants something for pain now.   Resp: clear to auscultation bilaterally GI: still mildly distended, BS hypoacitve, no flatus.  Dressing is dry.    Lab Results:  Recent Labs    07/19/19 0354  WBC 6.3  HGB 13.3  HCT 40.8  PLT 247    BMET Recent Labs    07/19/19 0354  NA 141  K 3.5  CL 106  CO2 24  GLUCOSE 82  BUN <5*  CREATININE 0.93  CALCIUM 8.6*   PT/INR Recent Labs    07/18/19 0434  LABPROT 13.1  INR 1.0    Recent Labs  Lab 07/13/19 1644  AST 27  ALT 24  ALKPHOS 83  BILITOT 2.1*  PROT 8.4*  ALBUMIN 4.6     Lipase     Component Value Date/Time   LIPASE 23 07/13/2019 1644     Medications: . alvimopan  12 mg Oral BID  . [START ON 07/21/2019] amLODipine  5 mg Oral Daily  . heparin injection (subcutaneous)  5,000 Units Subcutaneous Q8H  . pantoprazole (PROTONIX) IV  40 mg Intravenous Q24H  . sodium  chloride flush  3 mL Intravenous Once   . dextrose 5 % and 0.9 % NaCl with KCl 20 mEq/L 75 mL/hr at 07/20/19 0859  . dextrose 5 % and 0.9% NaCl 50 mL/hr at 07/19/19 1801  . methocarbamol (ROBAXIN) IV 500 mg (07/20/19 0141)    Assessment/Plan Hypertension Hx of active TB completed a multidrug regimen and 2017 Hx tobacco use  Obstruction colonic mass at the splenic flexure Laparoscopic assisted resection of splenic flexure colon, mobilization of splenic flexure, 07/19/2019 Dr. Autumn Messing  FEN: IV fluids/clear liquids ID: Cefotetan preop DVT: Heparin Follow-up Dr. Autumn Messing III Foley:in  Plan:  I will leave foley in for now and take out in AM.  Add Toradol, increase Robaxin and add Tylenol scheduled.  Continue clears for now until he is passing flatus.  I am adding more K+ to IV to maintain K+ at 4.0 level.  OOB to chair today is about all I think we can get today.       LOS: 7 days    Carmon Brigandi 07/20/2019 [phone]

## 2019-07-21 DIAGNOSIS — E876 Hypokalemia: Secondary | ICD-10-CM

## 2019-07-21 LAB — BASIC METABOLIC PANEL
Anion gap: 6 (ref 5–15)
BUN: 8 mg/dL (ref 6–20)
CO2: 25 mmol/L (ref 22–32)
Calcium: 8.4 mg/dL — ABNORMAL LOW (ref 8.9–10.3)
Chloride: 106 mmol/L (ref 98–111)
Creatinine, Ser: 0.91 mg/dL (ref 0.61–1.24)
GFR calc Af Amer: >60 mL/min (ref >60)
GFR calc non Af Amer: >60 mL/min (ref >60)
Glucose, Bld: 103 mg/dL — ABNORMAL HIGH (ref 70–99)
Potassium: 3.9 mmol/L (ref 3.5–5.1)
Sodium: 137 mmol/L (ref 135–145)

## 2019-07-21 MED ORDER — PANTOPRAZOLE SODIUM 40 MG PO TBEC
40.0000 mg | DELAYED_RELEASE_TABLET | Freq: Every day | ORAL | Status: DC
  Administered 2019-07-21 – 2019-07-22 (×2): 40 mg via ORAL
  Filled 2019-07-21 (×3): qty 1

## 2019-07-21 MED ORDER — ENSURE ENLIVE PO LIQD
237.0000 mL | Freq: Two times a day (BID) | ORAL | Status: DC
  Administered 2019-07-22: 14:00:00 237 mL via ORAL

## 2019-07-21 NOTE — Progress Notes (Signed)
Nutrition Follow-up  INTERVENTION:   -Ensure Enlive po BID, each supplement provides 350 kcal and 20 grams of protein  NUTRITION DIAGNOSIS:   Inadequate oral intake related to poor appetite as evidenced by per patient/family report.  Ongoing.  GOAL:   Patient will meet greater than or equal to 90% of their needs  Progressing.  MONITOR:   PO intake, Supplement acceptance, Labs, Weight trends, I & O's  ASSESSMENT:   58 year old male with past medical history relevant for hypertension, prior history of treatment for TB and tobacco abuse admitted on 07/13/2019 with abdominal pain and concerns about colon mass causing small and large bowel obstruction   8/20: s/p flex sig, colonic stent placement 8/24: s/p Laparoscopic assisted resection of splenic flexure colon, mobilization of splenic flexure  **RD working remotely**  Patient now on full liquids as of this morning. Will monitor PO intakes. Added Ensure supplements when pt is ready for additional kcal and protein.   Per weight records, pt with exact same weight since admission.  I/Os: +13.6L since admit UOP 8/25: 2.6L  Labs reviewed. Medications: D5 infusion  Diet Order:   Diet Order            Diet full liquid Room service appropriate? Yes; Fluid consistency: Thin  Diet effective now              EDUCATION NEEDS:   No education needs have been identified at this time  Skin:  Skin Assessment: Reviewed RN Assessment  Last BM:  8/26  Height:   Ht Readings from Last 1 Encounters:  07/19/19 6' (1.829 m)    Weight:   Wt Readings from Last 1 Encounters:  07/19/19 78.5 kg    Ideal Body Weight:  80.9 kg  BMI:  Body mass index is 23.46 kg/m.  Estimated Nutritional Needs:   Kcal:  1900-2100  Protein:  95-105g  Fluid:  2.1L/day  Clayton Bibles, MS, RD, LDN [place] [facility] Pager: [phone] After Hours Pager: [phone]

## 2019-07-21 NOTE — Patient Care Conference (Signed)
Called and updated patient on biopsy report. Pt is made aware of diagnosis. All questions answered. Have discussed with Oncology for assistance with arranging outpatient follow up.

## 2019-07-21 NOTE — Progress Notes (Signed)
PROGRESS NOTE    Billy Yates  [id number] DOB: Apr 22, 1961 DOA: 07/13/2019 PCP: Patient, No Pcp Per    Brief Narrative:  58 y.o. M with HTN who presented with abdominal pain.    In the ER, CT showed a circumferential colon mass and General Surgery recommended resection.  Assessment & Plan:   Active Problems:   Hypokalemia   Hyperbilirubinemia   Colonic mass  Colon mass, biopsy proven metastatic adenocarcinoma -Pt now s/p hemicolectomy on 8/24 with primary anastomosis -Advancing diet per General Surgery -biopsy results reviewed personally,, findings concerning for metastatic adenocarcinoma -Have discussed with Oncology for help arranging close outpatient follow up  Hypertension BP currently stable, albeit suboptimally controlled.   -Started 5mg  amlodipine tomorrow -Continue with PRN hydralazine as needed  DVT prophylaxis: Heparin subq Code Status: Full Family Communication: Pt in room, family not at bedside Disposition Plan: Uncertain at this time  Consultants:   General Surgery  Discussed with Oncology over phone  Procedures:   Hemicolectomy 8/24  Antimicrobials: Anti-infectives (From admission, onward)   Start     Dose/Rate Route Frequency Ordered Stop   07/19/19 0600  cefoTEtan (CEFOTAN) 2 g in sodium chloride 0.9 % 100 mL IVPB     2 g 200 mL/hr over 30 Minutes Intravenous On call to O.R. 07/16/19 1022 07/19/19 1331   07/18/19 1400  neomycin (MYCIFRADIN) tablet 1,000 mg     1,000 mg Oral 3 times per day 07/18/19 0706 07/18/19 2121   07/18/19 1400  metroNIDAZOLE (FLAGYL) tablet 1,000 mg     1,000 mg Oral 3 times per day 07/18/19 0706 07/18/19 2120   07/18/19 0715  cefoTEtan (CEFOTAN) 2 g in sodium chloride 0.9 % 100 mL IVPB  Status:  Discontinued     2 g 200 mL/hr over 30 Minutes Intravenous On call to O.R. 07/18/19 0706 07/18/19 0726       Subjective: Experiencing some difficulty tolerating certain soups and pudding secondary to nausea   Objective: Vitals:   07/20/19 2108 07/21/19 0550 07/21/19 1452 07/21/19 1454  BP: (!) 151/83 (!) 145/85 (!) 170/92 (!) 159/91  Pulse: 66 63 63 61  Resp: 16 14    Temp: 99.2 F (37.3 C) 98.4 F (36.9 C) 98.7 F (37.1 C)   TempSrc: Oral Oral Oral   SpO2: 99% 99% 99%   Weight:      Height:        Intake/Output Summary (Last 24 hours) at 07/21/2019 1652 Last data filed at 07/21/2019 1332 Gross per 24 hour  Intake 2876.12 ml  Output 1950 ml  Net 926.12 ml   Filed Weights   07/13/19 1615 07/15/19 1358 07/19/19 1210  Weight: 78.5 kg 78.5 kg 78.5 kg    Examination:  General exam: Appears calm and comfortable  Respiratory system: Clear to auscultation. Respiratory effort normal. Cardiovascular system: S1 & S2 heard, RRR Gastrointestinal system: Abdomen is nondistended, soft and nontender. No organomegaly or masses felt. Normal bowel sounds heard. Central nervous system: Alert and oriented. No focal neurological deficits. Extremities: Symmetric 5 x 5 power. Skin: No rashes, lesions  Psychiatry: Judgement and insight appear normal. Mood & affect appropriate.   Data Reviewed: I have personally reviewed following labs and imaging studies  CBC: Recent Labs  Lab 07/19/19 0354  WBC 6.3  NEUTROABS 3.2  HGB 13.3  HCT 40.8  MCV 93.8  PLT A999333   Basic Metabolic Panel: Recent Labs  Lab 07/19/19 0354 07/21/19 0331  NA 141 137  K 3.5 3.9  CL 106 106  CO2 24 25  GLUCOSE 82 103*  BUN <5* 8  CREATININE 0.93 0.91  CALCIUM 8.6* 8.4*   GFR: Estimated Creatinine Clearance: 99.5 mL/min (by C-G formula based on SCr of 0.91 mg/dL). Liver Function Tests: No results for input(s): AST, ALT, ALKPHOS, BILITOT, PROT, ALBUMIN in the last 168 hours. No results for input(s): LIPASE, AMYLASE in the last 168 hours. No results for input(s): AMMONIA in the last 168 hours. Coagulation Profile: Recent Labs  Lab 07/18/19 0434  INR 1.0   Cardiac Enzymes: No results for input(s): CKTOTAL,  CKMB, CKMBINDEX, TROPONINI in the last 168 hours. BNP (last 3 results) No results for input(s): PROBNP in the last 8760 hours. HbA1C: No results for input(s): HGBA1C in the last 72 hours. CBG: No results for input(s): GLUCAP in the last 168 hours. Lipid Profile: No results for input(s): CHOL, HDL, LDLCALC, TRIG, CHOLHDL, LDLDIRECT in the last 72 hours. Thyroid Function Tests: No results for input(s): TSH, T4TOTAL, FREET4, T3FREE, THYROIDAB in the last 72 hours. Anemia Panel: No results for input(s): VITAMINB12, FOLATE, FERRITIN, TIBC, IRON, RETICCTPCT in the last 72 hours. Sepsis Labs: No results for input(s): PROCALCITON, LATICACIDVEN in the last 168 hours.  Recent Results (from the past 240 hour(s))  SARS Coronavirus 2 [facility] order, Performed in [facility] hospital lab) Nasopharyngeal Nasopharyngeal Swab     Status: None   Collection Time: 07/13/19  7:45 PM   Specimen: Nasopharyngeal Swab  Result Value Ref Range Status   SARS Coronavirus 2 NEGATIVE NEGATIVE Final    Comment: (NOTE) If result is NEGATIVE SARS-CoV-2 target nucleic acids are NOT DETECTED. The SARS-CoV-2 RNA is generally detectable in upper and lower  respiratory specimens during the acute phase of infection. The lowest  concentration of SARS-CoV-2 viral copies this assay can detect is 250  copies / mL. A negative result does not preclude SARS-CoV-2 infection  and should not be used as the sole basis for treatment or other  patient management decisions.  A negative result may occur with  improper specimen collection / handling, submission of specimen other  than nasopharyngeal swab, presence of viral mutation(s) within the  areas targeted by this assay, and inadequate number of viral copies  (<250 copies / mL). A negative result must be combined with clinical  observations, patient history, and epidemiological information. If result is POSITIVE SARS-CoV-2 target nucleic acids are DETECTED. The SARS-CoV-2 RNA  is generally detectable in upper and lower  respiratory specimens dur ing the acute phase of infection.  Positive  results are indicative of active infection with SARS-CoV-2.  Clinical  correlation with patient history and other diagnostic information is  necessary to determine patient infection status.  Positive results do  not rule out bacterial infection or co-infection with other viruses. If result is PRESUMPTIVE POSTIVE SARS-CoV-2 nucleic acids MAY BE PRESENT.   A presumptive positive result was obtained on the submitted specimen  and confirmed on repeat testing.  While 2019 novel coronavirus  (SARS-CoV-2) nucleic acids may be present in the submitted sample  additional confirmatory testing may be necessary for epidemiological  and / or clinical management purposes  to differentiate between  SARS-CoV-2 and other Sarbecovirus currently known to infect humans.  If clinically indicated additional testing with an alternate test  methodology [phone]) is advised. The SARS-CoV-2 RNA is generally  detectable in upper and lower respiratory sp ecimens during the acute  phase of infection. The expected result is Negative. Fact Sheet for Patients:  StrictlyIdeas.no  Fact Sheet for Healthcare Providers: BankingDealers.co.za This test is not yet approved or cleared by the [place] FDA and has been authorized for detection and/or diagnosis of SARS-CoV-2 by FDA under an Emergency Use Authorization (EUA).  This EUA will remain in effect (meaning this test can be used) for the duration of the COVID-19 declaration under Section 564(b)(1) of the Act, 21 U.S.C. section 360bbb-3(b)(1), unless the authorization is terminated or revoked sooner. Performed at [facility], [address], [place], [place] [address]      Radiology Studies: No results found.  Scheduled Meds: . acetaminophen  1,000 mg Oral Q8H  . amLODipine  5 mg Oral  Daily  . feeding supplement (ENSURE ENLIVE)  237 mL Oral BID BM  . heparin injection (subcutaneous)  5,000 Units Subcutaneous Q8H  . ketorolac  15 mg Intravenous Q6H  . pantoprazole  40 mg Oral QHS   Continuous Infusions: . dextrose 5 % and 0.9 % NaCl with KCl 40 mEq/L 50 mL/hr at 07/21/19 0835  . methocarbamol (ROBAXIN) IV 1,000 mg (07/21/19 1332)     LOS: 8 days   Marylu Lund, MD [facility] Pager On Amion  If 7PM-7AM, please contact night-coverage 07/21/2019, 4:52 PM

## 2019-07-21 NOTE — Progress Notes (Signed)
2 Days Post-Op   Subjective/Chief Complaint: Feels better. Passing flatus   Objective: Vital signs in last 24 hours: Temp:  [98.4 F (36.9 C)-99.2 F (37.3 C)] 98.4 F (36.9 C) (08/26 0550) Pulse Rate:  [63-83] 63 (08/26 0550) Resp:  [14-18] 14 (08/26 0550) BP: (145-155)/(82-94) 145/85 (08/26 0550) SpO2:  [98 %-100 %] 99 % (08/26 0550) Last BM Date: 07/18/19  Intake/Output from previous day: 08/25 0701 - 08/26 0700 In: 2983.1 [P.O.:970; I.V.:1863.1; IV Piggyback:150] Out: 2600 [Urine:2600] Intake/Output this shift: No intake/output data recorded.  General appearance: alert and cooperative Resp: clear to auscultation bilaterally Cardio: regular rate and rhythm GI: soft, mild tenderness. incision looks good  Lab Results:  Recent Labs    07/19/19 0354  WBC 6.3  HGB 13.3  HCT 40.8  PLT 247   BMET Recent Labs    07/19/19 0354 07/21/19 0331  NA 141 137  K 3.5 3.9  CL 106 106  CO2 24 25  GLUCOSE 82 103*  BUN <5* 8  CREATININE 0.93 0.91  CALCIUM 8.6* 8.4*   PT/INR No results for input(s): LABPROT, INR in the last 72 hours. ABG No results for input(s): PHART, HCO3 in the last 72 hours.  Invalid input(s): PCO2, PO2  Studies/Results: No results found.  Anti-infectives: Anti-infectives (From admission, onward)   Start     Dose/Rate Route Frequency Ordered Stop   07/19/19 0600  cefoTEtan (CEFOTAN) 2 g in sodium chloride 0.9 % 100 mL IVPB     2 g 200 mL/hr over 30 Minutes Intravenous On call to O.R. 07/16/19 1022 07/19/19 1331   07/18/19 1400  neomycin (MYCIFRADIN) tablet 1,000 mg     1,000 mg Oral 3 times per day 07/18/19 0706 07/18/19 2121   07/18/19 1400  metroNIDAZOLE (FLAGYL) tablet 1,000 mg     1,000 mg Oral 3 times per day 07/18/19 0706 07/18/19 2120   07/18/19 0715  cefoTEtan (CEFOTAN) 2 g in sodium chloride 0.9 % 100 mL IVPB  Status:  Discontinued     2 g 200 mL/hr over 30 Minutes Intravenous On call to O.R. 07/18/19 0706 07/18/19 0726       Assessment/Plan: s/p Procedure(s): LAPAROSCOPIC ASSISTED RESECTION OF SPLENIC FLEXURE COLON, MOBOLIZATION OF SPLENIC FLEXURE (N/A) Advance diet. Allow fulls today Ambulate Await path  LOS: 8 days    Autumn Messing III 07/21/2019

## 2019-07-21 NOTE — Progress Notes (Addendum)
Pharmacy Brief Note - Alvimopan (Entereg) & IV to PO PPI The standing order set for alvimopan (Entereg) now includes an automatic order to discontinue the drug after the patient has had a bowel movement. The change was approved by the [place] and the Medical Executive Committee.  This patient has had a bowel movement documented by nursing. Therefore, alvimopan has been discontinued. If there are questions, please contact the pharmacy at [phone].  Thank you  IV PPI> PO  On scheduled IV robaxin 1 gm IV q8h - ? Change to PO and/or PRN?  Eudelia Bunch, Pharm.D [phone] 07/21/2019 11:00 AM

## 2019-07-22 DIAGNOSIS — K56609 Unspecified intestinal obstruction, unspecified as to partial versus complete obstruction: Secondary | ICD-10-CM

## 2019-07-22 MED ORDER — AMLODIPINE BESYLATE 10 MG PO TABS: 10.0000 mg | ORAL_TABLET | Freq: Every day | ORAL | Status: DC

## 2019-07-22 NOTE — Progress Notes (Signed)
PROGRESS NOTE    RAVIS PINGITORE  [id number] DOB: September 07, 1961 DOA: 07/13/2019 PCP: Patient, No Pcp Per    Brief Narrative:  58 y.o. M with HTN who presented with abdominal pain.    In the ER, CT showed a circumferential colon mass and General Surgery recommended resection.  Assessment & Plan:   Active Problems:   Hypokalemia   Hyperbilirubinemia   Colonic mass  Colon mass, biopsy proven metastatic adenocarcinoma -Pt now s/p hemicolectomy on 8/24 with primary anastomosis -Advancing diet per General Surgery -biopsy results reviewed personally,, findings concerning for metastatic adenocarcinoma -I already discussed with Oncology for help arranging close outpatient follow up  Hypertension BP currently stable, improved but still suboptimally controlled.   -Started 5mg  amlodipine, will increase to 10mg  -Continue with PRN hydralazine as needed  DVT prophylaxis: Heparin subq Code Status: Full Family Communication: Pt in room, family not at bedside Disposition Plan: Possible home in 24hrs  Consultants:   General Surgery  Discussed with Oncology over phone  Procedures:   Hemicolectomy 8/24  Antimicrobials: Anti-infectives (From admission, onward)   Start     Dose/Rate Route Frequency Ordered Stop   07/19/19 0600  cefoTEtan (CEFOTAN) 2 g in sodium chloride 0.9 % 100 mL IVPB     2 g 200 mL/hr over 30 Minutes Intravenous On call to O.R. 07/16/19 1022 07/19/19 1331   07/18/19 1400  neomycin (MYCIFRADIN) tablet 1,000 mg     1,000 mg Oral 3 times per day 07/18/19 0706 07/18/19 2121   07/18/19 1400  metroNIDAZOLE (FLAGYL) tablet 1,000 mg     1,000 mg Oral 3 times per day 07/18/19 0706 07/18/19 2120   07/18/19 0715  cefoTEtan (CEFOTAN) 2 g in sodium chloride 0.9 % 100 mL IVPB  Status:  Discontinued     2 g 200 mL/hr over 30 Minutes Intravenous On call to O.R. 07/18/19 0706 07/18/19 0726      Subjective: In good spirits. Tolerating diet and moving bowels, passing  flatus  Objective: Vitals:   07/21/19 1454 07/21/19 2123 07/22/19 0623 07/22/19 1320  BP: (!) 159/91 (!) 151/82 (!) 158/90 (!) 150/97  Pulse: 61 71 65 85  Resp:  18 16 17   Temp:  99.1 F (37.3 C) 98.5 F (36.9 C)   TempSrc:  Oral Oral   SpO2:  98% 100% 100%  Weight:      Height:        Intake/Output Summary (Last 24 hours) at 07/22/2019 1800 Last data filed at 07/22/2019 1700 Gross per 24 hour  Intake 1447.26 ml  Output 850 ml  Net 597.26 ml   Filed Weights   07/13/19 1615 07/15/19 1358 07/19/19 1210  Weight: 78.5 kg 78.5 kg 78.5 kg    Examination: General exam: Awake, laying in bed, in nad Respiratory system: Normal respiratory effort, no wheezing Cardiovascular system: regular rate, s1, s2 Gastrointestinal system: Soft, nondistended, positive BS Central nervous system: CN2-12 grossly intact, strength intact Extremities: Perfused, no clubbing Skin: Normal skin turgor, no notable skin lesions seen Psychiatry: Mood normal // no visual hallucinations   Data Reviewed: I have personally reviewed following labs and imaging studies  CBC: Recent Labs  Lab 07/19/19 0354  WBC 6.3  NEUTROABS 3.2  HGB 13.3  HCT 40.8  MCV 93.8  PLT A999333   Basic Metabolic Panel: Recent Labs  Lab 07/19/19 0354 07/21/19 0331  NA 141 137  K 3.5 3.9  CL 106 106  CO2 24 25  GLUCOSE 82 103*  BUN <5* 8  CREATININE 0.93 0.91  CALCIUM 8.6* 8.4*   GFR: Estimated Creatinine Clearance: 99.5 mL/min (by C-G formula based on SCr of 0.91 mg/dL). Liver Function Tests: No results for input(s): AST, ALT, ALKPHOS, BILITOT, PROT, ALBUMIN in the last 168 hours. No results for input(s): LIPASE, AMYLASE in the last 168 hours. No results for input(s): AMMONIA in the last 168 hours. Coagulation Profile: Recent Labs  Lab 07/18/19 0434  INR 1.0   Cardiac Enzymes: No results for input(s): CKTOTAL, CKMB, CKMBINDEX, TROPONINI in the last 168 hours. BNP (last 3 results) No results for input(s):  PROBNP in the last 8760 hours. HbA1C: No results for input(s): HGBA1C in the last 72 hours. CBG: No results for input(s): GLUCAP in the last 168 hours. Lipid Profile: No results for input(s): CHOL, HDL, LDLCALC, TRIG, CHOLHDL, LDLDIRECT in the last 72 hours. Thyroid Function Tests: No results for input(s): TSH, T4TOTAL, FREET4, T3FREE, THYROIDAB in the last 72 hours. Anemia Panel: No results for input(s): VITAMINB12, FOLATE, FERRITIN, TIBC, IRON, RETICCTPCT in the last 72 hours. Sepsis Labs: No results for input(s): PROCALCITON, LATICACIDVEN in the last 168 hours.  Recent Results (from the past 240 hour(s))  SARS Coronavirus 2 [facility] order, Performed in [facility] hospital lab) Nasopharyngeal Nasopharyngeal Swab     Status: None   Collection Time: 07/13/19  7:45 PM   Specimen: Nasopharyngeal Swab  Result Value Ref Range Status   SARS Coronavirus 2 NEGATIVE NEGATIVE Final    Comment: (NOTE) If result is NEGATIVE SARS-CoV-2 target nucleic acids are NOT DETECTED. The SARS-CoV-2 RNA is generally detectable in upper and lower  respiratory specimens during the acute phase of infection. The lowest  concentration of SARS-CoV-2 viral copies this assay can detect is 250  copies / mL. A negative result does not preclude SARS-CoV-2 infection  and should not be used as the sole basis for treatment or other  patient management decisions.  A negative result may occur with  improper specimen collection / handling, submission of specimen other  than nasopharyngeal swab, presence of viral mutation(s) within the  areas targeted by this assay, and inadequate number of viral copies  (<250 copies / mL). A negative result must be combined with clinical  observations, patient history, and epidemiological information. If result is POSITIVE SARS-CoV-2 target nucleic acids are DETECTED. The SARS-CoV-2 RNA is generally detectable in upper and lower  respiratory specimens dur ing the acute phase of  infection.  Positive  results are indicative of active infection with SARS-CoV-2.  Clinical  correlation with patient history and other diagnostic information is  necessary to determine patient infection status.  Positive results do  not rule out bacterial infection or co-infection with other viruses. If result is PRESUMPTIVE POSTIVE SARS-CoV-2 nucleic acids MAY BE PRESENT.   A presumptive positive result was obtained on the submitted specimen  and confirmed on repeat testing.  While 2019 novel coronavirus  (SARS-CoV-2) nucleic acids may be present in the submitted sample  additional confirmatory testing may be necessary for epidemiological  and / or clinical management purposes  to differentiate between  SARS-CoV-2 and other Sarbecovirus currently known to infect humans.  If clinically indicated additional testing with an alternate test  methodology [phone]) is advised. The SARS-CoV-2 RNA is generally  detectable in upper and lower respiratory sp ecimens during the acute  phase of infection. The expected result is Negative. Fact Sheet for Patients:  StrictlyIdeas.no Fact Sheet for Healthcare Providers: BankingDealers.co.za This test is not yet approved or cleared by the  [place] Architectural technologist and has been authorized for detection and/or diagnosis of SARS-CoV-2 by FDA under an Print production planner (EUA).  This EUA will remain in effect (meaning this test can be used) for the duration of the COVID-19 declaration under Section 564(b)(1) of the Act, 21 U.S.C. section 360bbb-3(b)(1), unless the authorization is terminated or revoked sooner. Performed at [facility], [address], [place], [place] [address]      Radiology Studies: No results found.  Scheduled Meds: . acetaminophen  1,000 mg Oral Q8H  . amLODipine  5 mg Oral Daily  . feeding supplement (ENSURE ENLIVE)  237 mL Oral BID BM  . heparin injection  (subcutaneous)  5,000 Units Subcutaneous Q8H  . ketorolac  15 mg Intravenous Q6H  . pantoprazole  40 mg Oral QHS   Continuous Infusions: . dextrose 5 % and 0.9 % NaCl with KCl 40 mEq/L 10 mL/hr at 07/22/19 1142  . methocarbamol (ROBAXIN) IV 1,000 mg (07/22/19 1547)     LOS: 9 days   Marylu Lund, MD [facility] Pager On Amion  If 7PM-7AM, please contact night-coverage 07/22/2019, 6:00 PM

## 2019-07-22 NOTE — Progress Notes (Signed)
3 Days Post-Op   Subjective/Chief Complaint: No complaints. Having flatus and bm's   Objective: Vital signs in last 24 hours: Temp:  [98.5 F (36.9 C)-99.1 F (37.3 C)] 98.5 F (36.9 C) (08/27 0623) Pulse Rate:  [61-71] 65 (08/27 0623) Resp:  [16-18] 16 (08/27 0623) BP: (151-170)/(82-92) 158/90 (08/27 0623) SpO2:  [98 %-100 %] 100 % (08/27 0623) Last BM Date: 07/21/19  Intake/Output from previous day: 08/26 0701 - 08/27 0700 In: 1618 [P.O.:870; I.V.:648; IV Piggyback:100] Out: 1300 [Urine:1300] Intake/Output this shift: No intake/output data recorded.  General appearance: alert and cooperative Resp: clear to auscultation bilaterally Cardio: regular rate and rhythm GI: soft, nontender. incision looks good  Lab Results:  No results for input(s): WBC, HGB, HCT, PLT in the last 72 hours. BMET Recent Labs    07/21/19 0331  NA 137  K 3.9  CL 106  CO2 25  GLUCOSE 103*  BUN 8  CREATININE 0.91  CALCIUM 8.4*   PT/INR No results for input(s): LABPROT, INR in the last 72 hours. ABG No results for input(s): PHART, HCO3 in the last 72 hours.  Invalid input(s): PCO2, PO2  Studies/Results: No results found.  Anti-infectives: Anti-infectives (From admission, onward)   Start     Dose/Rate Route Frequency Ordered Stop   07/19/19 0600  cefoTEtan (CEFOTAN) 2 g in sodium chloride 0.9 % 100 mL IVPB     2 g 200 mL/hr over 30 Minutes Intravenous On call to O.R. 07/16/19 1022 07/19/19 1331   07/18/19 1400  neomycin (MYCIFRADIN) tablet 1,000 mg     1,000 mg Oral 3 times per day 07/18/19 0706 07/18/19 2121   07/18/19 1400  metroNIDAZOLE (FLAGYL) tablet 1,000 mg     1,000 mg Oral 3 times per day 07/18/19 0706 07/18/19 2120   07/18/19 0715  cefoTEtan (CEFOTAN) 2 g in sodium chloride 0.9 % 100 mL IVPB  Status:  Discontinued     2 g 200 mL/hr over 30 Minutes Intravenous On call to O.R. 07/18/19 0706 07/18/19 0726      Assessment/Plan: s/p Procedure(s): LAPAROSCOPIC ASSISTED  RESECTION OF SPLENIC FLEXURE COLON, MOBOLIZATION OF SPLENIC FLEXURE (N/A) Advance diet. Start soft today Ambulate Path shows colon cancer. I discussed this with him  LOS: 9 days    Billy Yates 07/22/2019

## 2019-07-23 ENCOUNTER — Encounter: Payer: Self-pay | Admitting: *Deleted

## 2019-07-23 ENCOUNTER — Telehealth: Payer: Self-pay | Admitting: *Deleted

## 2019-07-23 ENCOUNTER — Other Ambulatory Visit: Payer: Self-pay | Admitting: Internal Medicine

## 2019-07-23 ENCOUNTER — Telehealth: Payer: Self-pay | Admitting: Hematology

## 2019-07-23 MED ORDER — HYDROCODONE-ACETAMINOPHEN 5-325 MG PO TABS: 1.0000 | ORAL_TABLET | Freq: Four times a day (QID) | ORAL | 0 refills | Status: AC | PRN

## 2019-07-23 NOTE — Telephone Encounter (Signed)
Received a staff msg to schedule [name] to see Dr. Burr Medico on 8/31 at 230pm. I cld both numbers in his demographics and was told I had the wrong number. I updated the providers about this.

## 2019-07-23 NOTE — Telephone Encounter (Signed)
Called patient to notify him of appointment with Dr. Burr Medico.  Patient is dependent on his girlfriend for transportation and is unable to come to appointment.  After discussing with patient and Dr. Burr Medico, we will arrange for an appointment with the oncologist at [facility] since patient lives in that area.

## 2019-07-23 NOTE — Progress Notes (Signed)
4 Days Post-Op   Subjective/Chief Complaint: No complaints   Objective: Vital signs in last 24 hours: Temp:  [98.2 F (36.8 C)-98.6 F (37 C)] 98.2 F (36.8 C) (08/28 0513) Pulse Rate:  [60-85] 60 (08/28 0513) Resp:  [17-19] 19 (08/28 0513) BP: (147-150)/(82-97) 148/86 (08/28 0513) SpO2:  [100 %] 100 % (08/28 0513) Last BM Date: 07/22/19  Intake/Output from previous day: 08/27 0701 - 08/28 0700 In: 1407.3 [P.O.:900; I.V.:367.3; IV Piggyback:140] Out: 3 [Urine:3] Intake/Output this shift: No intake/output data recorded.  General appearance: alert and cooperative Resp: clear to auscultation bilaterally Cardio: regular rate and rhythm GI: soft, nontender. incision looks good  Lab Results:  No results for input(s): WBC, HGB, HCT, PLT in the last 72 hours. BMET Recent Labs    07/21/19 0331  NA 137  K 3.9  CL 106  CO2 25  GLUCOSE 103*  BUN 8  CREATININE 0.91  CALCIUM 8.4*   PT/INR No results for input(s): LABPROT, INR in the last 72 hours. ABG No results for input(s): PHART, HCO3 in the last 72 hours.  Invalid input(s): PCO2, PO2  Studies/Results: No results found.  Anti-infectives: Anti-infectives (From admission, onward)   Start     Dose/Rate Route Frequency Ordered Stop   07/19/19 0600  cefoTEtan (CEFOTAN) 2 g in sodium chloride 0.9 % 100 mL IVPB     2 g 200 mL/hr over 30 Minutes Intravenous On call to O.R. 07/16/19 1022 07/19/19 1331   07/18/19 1400  neomycin (MYCIFRADIN) tablet 1,000 mg     1,000 mg Oral 3 times per day 07/18/19 0706 07/18/19 2121   07/18/19 1400  metroNIDAZOLE (FLAGYL) tablet 1,000 mg     1,000 mg Oral 3 times per day 07/18/19 0706 07/18/19 2120   07/18/19 0715  cefoTEtan (CEFOTAN) 2 g in sodium chloride 0.9 % 100 mL IVPB  Status:  Discontinued     2 g 200 mL/hr over 30 Minutes Intravenous On call to O.R. 07/18/19 0706 07/18/19 0726      Assessment/Plan: s/p Procedure(s): LAPAROSCOPIC ASSISTED RESECTION OF SPLENIC FLEXURE  COLON, MOBOLIZATION OF SPLENIC FLEXURE (N/A) Advance diet Discharge  LOS: 10 days    Autumn Messing III 07/23/2019

## 2019-07-23 NOTE — Discharge Summary (Signed)
Physician Discharge Summary  Billy Yates [id number] DOB: [birth year] DOA: 07/13/2019  PCP: Patient, No Pcp Per  Admit date: 07/13/2019 Discharge date: 07/23/2019  Admitted From: Home Disposition:  Home  Pt was discharged by General Surgery before pt could be seen  Recommendations for Outpatient Follow-up:  1. Follow up with PCP in 1-2 weeks 2. Follow up with Oncology as scheduled  CODE STATUS:Full Diet recommendation: Regular   Brief/Interim Summary: 58 y.o.M with HTN who presented with abdominal pain.   In the ER, CT showed a circumferential colon mass and General Surgery recommended resection.   Discharge Diagnoses:  Active Problems:   Hypokalemia   Hyperbilirubinemia   Colonic mass  Colon mass, biopsy proven metastatic adenocarcinoma -Pt now s/p hemicolectomy on 8/24 with primary anastomosis -Advancing diet per General Surgery -biopsy results reviewed personally,, findings concerning for metastatic adenocarcinoma -I already discussed with Oncology for help arranging close outpatient follow up  Hypertension BP currently stable, improved but still suboptimally controlled.  -Started 5mg  amlodipine, increased to 10mg  in hospital  Discharge Instructions  Discharge Instructions    Call MD for:  difficulty breathing, headache or visual disturbances   Complete by: As directed    Call MD for:  extreme fatigue   Complete by: As directed    Call MD for:  hives   Complete by: As directed    Call MD for:  persistant dizziness or light-headedness   Complete by: As directed    Call MD for:  persistant nausea and vomiting   Complete by: As directed    Call MD for:  redness, tenderness, or signs of infection (pain, swelling, redness, odor or green/yellow discharge around incision site)   Complete by: As directed    Call MD for:  severe uncontrolled pain   Complete by: As directed    Call MD for:  temperature >100.4   Complete by: As directed    Diet - low  sodium heart healthy   Complete by: As directed    Discharge instructions   Complete by: As directed    May shower. No heavy lifting. Diet as tolerated   Increase activity slowly   Complete by: As directed    No wound care   Complete by: As directed      Allergies as of 07/23/2019   No Known Allergies     Medication List    TAKE these medications   cyclobenzaprine 5 MG tablet Commonly known as: FLEXERIL Take 5 mg by mouth daily as needed for muscle spasms.   HYDROcodone-acetaminophen 5-325 MG tablet Commonly known as: NORCO/VICODIN Take 1-2 tablets by mouth every 6 (six) hours as needed for moderate pain or severe pain.      Follow-up Information    Autumn Messing III, MD Follow up in 1 week(s).   Specialty: General Surgery Contact information: [place] [address] [phone]          No Known Allergies  Consultations:  General Surgery  Procedures/Studies: X-ray Chest Pa And Lateral  Result Date: 07/13/2019 CLINICAL DATA:  Preop. EXAM: CHEST - 2 VIEW COMPARISON:  Lung bases from abdominal CT earlier this day. FINDINGS: The cardiomediastinal contours are normal. Trace right pleural effusion on CT not well visualized. Mild right apically pleuroparenchymal scarring. Pulmonary vasculature is normal. No consolidation or pneumothorax. No acute osseous abnormalities are seen. IMPRESSION: Trace right pleural effusion on CT not well visualized radiographically. Mild right apical pleuroparenchymal scarring. Electronically Signed   By: Threasa Beards  Sanford M.D.   On: 07/13/2019 20:48   Ct Abdomen Pelvis W Contrast  Result Date: 07/13/2019 CLINICAL DATA:  Increasing abdominal pain after eating with constipation, nausea and vomiting for 1 week. 10 pound weight loss in the past month. EXAM: CT ABDOMEN AND PELVIS WITH CONTRAST TECHNIQUE: Multidetector CT imaging of the abdomen and pelvis was performed using the standard protocol following bolus administration  of intravenous contrast. CONTRAST:  100 mL OMNIPAQUE IOHEXOL 300 MG/ML  SOLN COMPARISON:  None. FINDINGS: Lower chest: Lung bases are clear. No pleural or pericardial effusion. Hepatobiliary: No focal liver abnormality is seen. No gallstones, gallbladder wall thickening, or biliary dilatation. Pancreas: Unremarkable. No pancreatic ductal dilatation or surrounding inflammatory changes. Spleen: Normal in size without focal abnormality. Adrenals/Urinary Tract: Adrenal glands are unremarkable. Kidneys are normal, without renal calculi, focal lesion, or hydronephrosis. Bladder is unremarkable. Stomach/Bowel: The patient has a stricturing mass lesion with an "apple-core" configuration just proximal to the splenic flexure of the colon as seen on image 12 of series 2. The lesion results in obstruction of the ascending and transverse colon and small bowel. Mild distention of the stomach is also seen. No CT signs of bowel ischemia. The appendix appears normal. Vascular/Lymphatic: No significant vascular findings are present. No enlarged abdominal or pelvic lymph nodes. Reproductive: Mild prostatomegaly. Other: None. Musculoskeletal: No lytic or sclerotic lesion. Multilevel lumbar facet degenerative change is noted. IMPRESSION: Mass lesion in the colon with an apple-core configuration just proximal to the splenic flexure is most consistent with carcinoma. The lesion results in obstruction of the ascending and transverse colon and small bowel. No metastatic disease identified. Mild prostatomegaly. These results were called by telephone at the time of interpretation on 07/13/2019 at 7:15 pm to Dr. Aletta Edouard , who verbally acknowledged these results. Electronically Signed   By: Inge Rise M.D.   On: 07/13/2019 19:18   Dg Ercp Biliary & Pancreatic Ducts  Result Date: 07/16/2019 CLINICAL DATA:  58 year old male with a history of colonic stent placement EXAM: COLONOSCOPY/SIGMOIDOSCOPY AND TREATMENT TECHNIQUE: Multiple  spot images obtained with the fluoroscopic device and submitted for interpretation post-procedure. FLUOROSCOPY TIME:  Fluoroscopy Time:  3 minutes 10 seconds COMPARISON:  None. FINDINGS: Limited images during sigmoidoscopy/colonoscopy demonstrates placement of a palliative colonic stent. IMPRESSION: Limited images demonstrate placement of palliative colonic stent. Please refer to the dictated operative report for full details of intraoperative findings and procedure. Electronically Signed   By: Corrie Mckusick D.O.   On: 07/16/2019 12:41      Discharge Exam: Vitals:   07/22/19 2106 07/23/19 0513  BP: (!) 147/82 (!) 148/86  Pulse: 77 60  Resp: 19 19  Temp: 98.6 F (37 C) 98.2 F (36.8 C)  SpO2: 100% 100%   Vitals:   07/22/19 0623 07/22/19 1320 07/22/19 2106 07/23/19 0513  BP: (!) 158/90 (!) 150/97 (!) 147/82 (!) 148/86  Pulse: 65 85 77 60  Resp: 16 17 19 19   Temp: 98.5 F (36.9 C)  98.6 F (37 C) 98.2 F (36.8 C)  TempSrc: Oral  Oral Oral  SpO2: 100% 100% 100% 100%  Weight:      Height:         The results of significant diagnostics from this hospitalization (including imaging, microbiology, ancillary and laboratory) are listed below for reference.     Microbiology: Recent Results (from the past 240 hour(s))  SARS Coronavirus 2 [facility] order, Performed in [facility] hospital lab) Nasopharyngeal Nasopharyngeal Swab     Status:  None   Collection Time: 07/13/19  7:45 PM   Specimen: Nasopharyngeal Swab  Result Value Ref Range Status   SARS Coronavirus 2 NEGATIVE NEGATIVE Final    Comment: (NOTE) If result is NEGATIVE SARS-CoV-2 target nucleic acids are NOT DETECTED. The SARS-CoV-2 RNA is generally detectable in upper and lower  respiratory specimens during the acute phase of infection. The lowest  concentration of SARS-CoV-2 viral copies this assay can detect is 250  copies / mL. A negative result does not preclude SARS-CoV-2 infection  and should not be used as the sole  basis for treatment or other  patient management decisions.  A negative result may occur with  improper specimen collection / handling, submission of specimen other  than nasopharyngeal swab, presence of viral mutation(s) within the  areas targeted by this assay, and inadequate number of viral copies  (<250 copies / mL). A negative result must be combined with clinical  observations, patient history, and epidemiological information. If result is POSITIVE SARS-CoV-2 target nucleic acids are DETECTED. The SARS-CoV-2 RNA is generally detectable in upper and lower  respiratory specimens dur ing the acute phase of infection.  Positive  results are indicative of active infection with SARS-CoV-2.  Clinical  correlation with patient history and other diagnostic information is  necessary to determine patient infection status.  Positive results do  not rule out bacterial infection or co-infection with other viruses. If result is PRESUMPTIVE POSTIVE SARS-CoV-2 nucleic acids MAY BE PRESENT.   A presumptive positive result was obtained on the submitted specimen  and confirmed on repeat testing.  While 2019 novel coronavirus  (SARS-CoV-2) nucleic acids may be present in the submitted sample  additional confirmatory testing may be necessary for epidemiological  and / or clinical management purposes  to differentiate between  SARS-CoV-2 and other Sarbecovirus currently known to infect humans.  If clinically indicated additional testing with an alternate test  methodology [phone]) is advised. The SARS-CoV-2 RNA is generally  detectable in upper and lower respiratory sp ecimens during the acute  phase of infection. The expected result is Negative. Fact Sheet for Patients:  StrictlyIdeas.no Fact Sheet for Healthcare Providers: BankingDealers.co.za This test is not yet approved or cleared by the [place] FDA and has been authorized for detection  and/or diagnosis of SARS-CoV-2 by FDA under an Emergency Use Authorization (EUA).  This EUA will remain in effect (meaning this test can be used) for the duration of the COVID-19 declaration under Section 564(b)(1) of the Act, 21 U.S.C. section 360bbb-3(b)(1), unless the authorization is terminated or revoked sooner. Performed at [facility], [address] [place], [place] [address]      Labs: BNP (last 3 results) No results for input(s): BNP in the last 8760 hours. Basic Metabolic Panel: Recent Labs  Lab 07/19/19 0354 07/21/19 0331  NA 141 137  K 3.5 3.9  CL 106 106  CO2 24 25  GLUCOSE 82 103*  BUN <5* 8  CREATININE 0.93 0.91  CALCIUM 8.6* 8.4*   Liver Function Tests: No results for input(s): AST, ALT, ALKPHOS, BILITOT, PROT, ALBUMIN in the last 168 hours. No results for input(s): LIPASE, AMYLASE in the last 168 hours. No results for input(s): AMMONIA in the last 168 hours. CBC: Recent Labs  Lab 07/19/19 0354  WBC 6.3  NEUTROABS 3.2  HGB 13.3  HCT 40.8  MCV 93.8  PLT 247   Cardiac Enzymes: No results for input(s): CKTOTAL, CKMB, CKMBINDEX, TROPONINI in the last 168 hours.  BNP: Invalid input(s): POCBNP CBG: No results for input(s): GLUCAP in the last 168 hours. D-Dimer No results for input(s): DDIMER in the last 72 hours. Hgb A1c No results for input(s): HGBA1C in the last 72 hours. Lipid Profile No results for input(s): CHOL, HDL, LDLCALC, TRIG, CHOLHDL, LDLDIRECT in the last 72 hours. Thyroid function studies No results for input(s): TSH, T4TOTAL, T3FREE, THYROIDAB in the last 72 hours.  Invalid input(s): FREET3 Anemia work up No results for input(s): VITAMINB12, FOLATE, FERRITIN, TIBC, IRON, RETICCTPCT in the last 72 hours. Urinalysis    Component Value Date/Time   COLORURINE AMBER (A) 07/13/2019 1644   APPEARANCEUR CLEAR 07/13/2019 1644   LABSPEC >1.030 (H) 07/13/2019 1644   PHURINE 6.0 07/13/2019 1644   GLUCOSEU NEGATIVE  07/13/2019 1644   HGBUR MODERATE (A) 07/13/2019 1644   BILIRUBINUR MODERATE (A) 07/13/2019 1644   KETONESUR 15 (A) 07/13/2019 1644   PROTEINUR 30 (A) 07/13/2019 1644   NITRITE NEGATIVE 07/13/2019 1644   LEUKOCYTESUR NEGATIVE 07/13/2019 1644   Sepsis Labs Invalid input(s): PROCALCITONIN,  WBC,  LACTICIDVEN Microbiology Recent Results (from the past 240 hour(s))  SARS Coronavirus 2 [facility] order, Performed in [facility] hospital lab) Nasopharyngeal Nasopharyngeal Swab     Status: None   Collection Time: 07/13/19  7:45 PM   Specimen: Nasopharyngeal Swab  Result Value Ref Range Status   SARS Coronavirus 2 NEGATIVE NEGATIVE Final    Comment: (NOTE) If result is NEGATIVE SARS-CoV-2 target nucleic acids are NOT DETECTED. The SARS-CoV-2 RNA is generally detectable in upper and lower  respiratory specimens during the acute phase of infection. The lowest  concentration of SARS-CoV-2 viral copies this assay can detect is 250  copies / mL. A negative result does not preclude SARS-CoV-2 infection  and should not be used as the sole basis for treatment or other  patient management decisions.  A negative result may occur with  improper specimen collection / handling, submission of specimen other  than nasopharyngeal swab, presence of viral mutation(s) within the  areas targeted by this assay, and inadequate number of viral copies  (<250 copies / mL). A negative result must be combined with clinical  observations, patient history, and epidemiological information. If result is POSITIVE SARS-CoV-2 target nucleic acids are DETECTED. The SARS-CoV-2 RNA is generally detectable in upper and lower  respiratory specimens dur ing the acute phase of infection.  Positive  results are indicative of active infection with SARS-CoV-2.  Clinical  correlation with patient history and other diagnostic information is  necessary to determine patient infection status.  Positive results do  not rule out  bacterial infection or co-infection with other viruses. If result is PRESUMPTIVE POSTIVE SARS-CoV-2 nucleic acids MAY BE PRESENT.   A presumptive positive result was obtained on the submitted specimen  and confirmed on repeat testing.  While 2019 novel coronavirus  (SARS-CoV-2) nucleic acids may be present in the submitted sample  additional confirmatory testing may be necessary for epidemiological  and / or clinical management purposes  to differentiate between  SARS-CoV-2 and other Sarbecovirus currently known to infect humans.  If clinically indicated additional testing with an alternate test  methodology [phone]) is advised. The SARS-CoV-2 RNA is generally  detectable in upper and lower respiratory sp ecimens during the acute  phase of infection. The expected result is Negative. Fact Sheet for Patients:  StrictlyIdeas.no Fact Sheet for Healthcare Providers: BankingDealers.co.za This test is not yet approved or cleared by the [place] FDA and has been authorized for  detection and/or diagnosis of SARS-CoV-2 by FDA under an Emergency Use Authorization (EUA).  This EUA will remain in effect (meaning this test can be used) for the duration of the COVID-19 declaration under Section 564(b)(1) of the Act, 21 U.S.C. section 360bbb-3(b)(1), unless the authorization is terminated or revoked sooner. Performed at [facility], [address], [place], [place] [address]      SIGNED:   Marylu Lund, MD  [facility] 07/23/2019, 3:11 PM  If 7PM-7AM, please contact night-coverage

## 2019-07-23 NOTE — Progress Notes (Signed)
Discharge and medication instructions reviewed with patient. Questions answered and patient denies further questions. No prescriptions given. Family member to drive patient home. Donne Hazel, RN

## 2019-07-23 NOTE — Progress Notes (Signed)
Received referral from [facility] due to patient having transportation issues and living in [place]. He is only able to travel every other Wednesday and Friday. Patient will see Dr Maylon Peppers on 08/06/2019.  Reached out to Leeanne Deed to introduce myself as the office RN Navigator and explain our new patient process. Reviewed the reason for their referral and scheduled their new patient appointment along with labs. Provided address and directions to the office including call back phone number. Reviewed with patient any concerns they may have or any possible barriers to attending their appointment.   Informed patient about my role as a navigator and that I will meet with them prior to their New Patient appointment and more fully discuss what services I can provide. At this time patient has no further questions or needs.   During the phone call the patient stated he didn't have anything to write with, and could remember everything. I have mailed the patient a welcome packet including appointment calendar, navigator intro, office intro and map. I included my card with direct call back number. Patient is aware to look for this in the mail.

## 2019-07-26 ENCOUNTER — Ambulatory Visit: Payer: BC Managed Care – PPO | Admitting: Hematology

## 2019-08-05 ENCOUNTER — Other Ambulatory Visit: Payer: Self-pay | Admitting: Hematology

## 2019-08-05 DIAGNOSIS — C185 Malignant neoplasm of splenic flexure: Secondary | ICD-10-CM

## 2019-08-05 DIAGNOSIS — C189 Malignant neoplasm of colon, unspecified: Secondary | ICD-10-CM | POA: Insufficient documentation

## 2019-08-05 DIAGNOSIS — Z8611 Personal history of tuberculosis: Secondary | ICD-10-CM

## 2019-08-05 NOTE — Progress Notes (Signed)
[place] CONSULT NOTE  Patient Care Team: Patient, No Pcp Per as PCP - General (General Practice) Cordelia Poche, RN as Oncology Nurse Navigator Tish Men, MD as Medical Oncologist (Hematology)  HEME/ONC OVERVIEW: 1. Stage IIIB (pT3,pN1b,cMx) colon adenocarcinoma of splenic flexure, MMR proficient  -06/2019:   Colon mass proximal to splenic flexure on CT  Lap-assisted splenic flexure resection; path showed pT3pN1b adenocarcinoma of the colon, no LVI or PNI, 2/13 LN+  -Late 07/2019 - present: adjuvant CAPOX x 3 months   TREATMENT REGIMEN:  CAPOX, adjuvant; date TBD; plan for 3 months (ie 4 cycles)  PERTINENT NON-HEM/ONC PROBLEMS: 1. Hx of active TB, treated in 2018  ASSESSMENT & PLAN:   Stage IIIB (pT3,pN1b,cMx) colon adenocarcinoma of splenic flexure, MMR proficient  -I reviewed the patient records in detail, including recent hospitalization notes, lab studies, imaging results, and pathology reports -I also independently reviewed CT abdomen/pelvis, and agree with findings documented -In summary, patient presented to [facility] ER in mid-06/2019 for 75-monthof progressive abdominal pain, associated with 10 pound weight loss and change in bowel movement.  CT abdomen/pelvis showed mass in the colon with an apple-core configuration proximal to the splenic flexure, concerning for malignancy.  He underwent flexible sigmoidoscopy, which showed a fungating, infiltrative mass causing obstruction in the distal transverse colon.  He was taken to the OR for laparoscopic-assisted resection of the splenic flexure colon, which showed adenocarcinoma, pT3pN1b.  He was referred to oncology for adjuvant treatment. -I reviewed the pathology and imaging results in detail with the patient -We also discussed the NCCN guidelines in detail -I have ordered CT chest with contrast to complete staging and to rule out any metastatic disease -If CT chest is negative, then the patient has Stage IIIB colon  cancer, which would benefit from adjuvant chemotherapy to reduce the risk of disease recurrence -Based on the final pathology, he is considered a low-risk Stage III disease, and therefore 3 months of CAPOX would be reasonable, especially given his hx of immunosuppression due to TB  -We discussed some of the risks, benefits and side-effects of capecitabine and oxaliplatin. The treatmenet is q3weeks x 3 months (ie 4 cycles). -Some of the short term side-effects included, though not limited to, risk of fatigue, weight loss, tumor lysis syndrome, risk of allergic reactions, pancytopenia, life-threatening infections, need for transfusions of blood products, nausea, vomiting, change in bowel habits, admission to hospital for various reasons, and risks of death.  -Long term side-effects are also discussed including permanent damage to nerve function, chronic fatigue, and rare secondary malignancy including bone marrow disorders.  -The patient is aware that the response rates discussed earlier is not guaranteed.   -After a long discussion, patient made an informed decision to proceed with the prescribed plan of care.  -In anticipation of chemotherapy, I have ordered port placement  -In addition, I have prescribed PRN anti-emetics, including Zofran, Compazine and dexamethasone   Hx of TB  -Treated with 6 months of TB therapy, completed in 2018  -Currently asymptomatic  -I have ordered TB gold quantiferon to assess for any active TB, as it would impact his ability to tolerate immunosuppressive treatment   Orders Placed This Encounter  Procedures   CT CHEST W CONTRAST    Standing Status:   Future    Standing Expiration Date:   08/05/2020    Order Specific Question:   ** REASON FOR EXAM (FREE TEXT)    Answer:   Stage III colon cancer s/p resection,  need CT chest to rule out mets    Order Specific Question:   If indicated for the ordered procedure, I authorize the administration of contrast media per  Radiology protocol    Answer:   Yes    Order Specific Question:   Preferred imaging location?    Answer:   [facility] Specific Question:   Radiology Contrast Protocol - do NOT remove file path    Answer:   \[email or web address]   IR IMAGING GUIDED PORT INSERTION    Standing Status:   Future    Standing Expiration Date:   10/05/2020    Order Specific Question:   Reason for Exam (SYMPTOM  OR DIAGNOSIS REQUIRED)    Answer:   Stage III colon cancer needing adjuvant chemo    Order Specific Question:   Preferred Imaging Location?    Answer:   [facility]   CBC with Differential ([place] Only)    Standing Status:   Standing    Number of Occurrences:   20    Standing Expiration Date:   3/53/6144   Basic Metabolic Panel - [place] Only    Standing Status:   Standing    Number of Occurrences:   20    Standing Expiration Date:   08/05/2020   All questions were answered. The patient knows to call the clinic with any problems, questions or concerns.  Tentatively return on 08/17/2019 for labs, port flush, clinic appt and 1st cycle of chemotherapy.   Tish Men, MD 08/06/2019 10:43 AM   CHIEF COMPLAINTS/PURPOSE OF CONSULTATION:  "I am doing better"  HISTORY OF PRESENTING ILLNESS:  Billy Yates 58 y.o. male is here because of newly diagnosed adenocarcinoma of the splenic flexure colon.  Patient presented to [facility] ER in mid-06/2019 for 3 months of progressive abdominal pain, associated with 10 pound weight loss and  change in bowel movement.  CT abdomen/pelvis showed mass in the colon with an apple-core configuration proximal to the splenic flexure, concerning for malignancy.  He underwent flexible sigmoidoscopy, which showed a fungating, infiltrative mass causing obstruction in the distal transverse colon.  He was taken to the OR for laparoscopic-assisted resection of the splenic flexure colon, which showed adenocarcinoma, pT3pN1b.  He  was referred to oncology for adjuvant treatment.  He reports that since the surgery, his bowel movement has normalized and his appetite improved significantly.  He has gained some weight back.  He still has occasional abdominal pain, localized to the incision site, for which he takes Tylenol with modest improvement.  He has not taken any Norco that was prescribed after the surgery.  He denies any persistent abdominal pain, drainage from the incision site, constipation, diarrhea, hematochezia, or melena.  He quit smoking in 06/2019 after the surgery.  Of note, patient has a history of active TB in 2018 after serving in the [employer] and traveling extensively.  He completed 6 months of anti-TB therapy, administered by [facility].   I have reviewed his chart and materials related to his cancer extensively and collaborated history with the patient. Summary of oncologic history is as follows: Oncology History  Colon cancer ([place])  07/13/2019 Imaging   CT abdomen/pelvis w/ contrast: IMPRESSION: Mass lesion in the colon with an apple-core configuration just proximal to the splenic flexure is most consistent with carcinoma. The lesion results in obstruction of the ascending and transverse colon and small bowel. No metastatic disease identified.   Mild prostatomegaly.  07/15/2019 Procedure   Flexible sigmoidoscopy: A fungating, infiltrative and ulcerated completely obstructing large mass was found in the distal transverse colon. The mass was circumferential. The mass measured five cm in length. No bleeding was present. This was stented with a 22 mm x 9 cm WallFlex stent. Estimated blood loss: none.   07/19/2019 Surgery   LAPAROSCOPIC ASSISTED RESECTION OF SPLENIC FLEXURE COLON, MOBOLIZATION OF SPLENIC FLEXURE (N/A)   07/19/2019 Pathology Results   Accession: [id number]  Procedure: Segmental resection Tumor Site: Tumor Size: 5.5 Macroscopic Tumor Perforation: Not  identified Histologic Type: Adenocarcinoma Histologic Grade: G2, Moderately differentiated Tumor Extension: Tumor invades through the muscularis propria into pericolorectal tissue Margins: All margins are uninvolved by invasive carcinoma, high grade dysplasia / intramucosal carcinoma, and low grade dysplasia Treatment effect: No known presurgical therapy Lymphovascular Invasion: Present Perineural Invasion: Not identified Tumor Deposits: Not identified Regional Lymph Nodes: Number of Lymph Nodes Involved: 2 Number of Lymph Nodes Examined: 13 Pathologic Stage Classification (pTNM, AJCC 8th Edition): pT3, pN1b 1 of     07/19/2019 Cancer Staging   Staging form: Colon and Rectum, AJCC 8th Edition - Pathologic stage from 07/19/2019: Stage IIIB (pT3, pN1b, cM0) - Signed by Tish Men, MD on 08/05/2019   08/05/2019 Initial Diagnosis   Colon cancer ([place])   08/19/2019 -  Chemotherapy   The patient had PALONOSETRON HCL INJECTION 0.25 MG/5ML, 0.25 mg, Intravenous,  Once, 0 of 4 cycles oxaliplatin (ELOXATIN) 260 mg in dextrose 5 % 500 mL chemo infusion, 130 mg/m2, Intravenous,  Once, 0 of 4 cycles  for chemotherapy treatment.      MEDICAL HISTORY:  Past Medical History:  Diagnosis Date   Colonic mass    splenic flexure   HTN (hypertension)    stopped taking meds in past as BP improved   Tuberculosis     SURGICAL HISTORY: Past Surgical History:  Procedure Laterality Date   ACHILLES TENDON REPAIR     COLONIC STENT PLACEMENT N/A 07/15/2019   Procedure: COLONIC STENT PLACEMENT;  Surgeon: Carol Ada, MD;  Location: [facility] ENDOSCOPY;  Service: Endoscopy;  Laterality: N/A;   FLEXIBLE SIGMOIDOSCOPY Left 07/15/2019   Procedure: FLEXIBLE SIGMOIDOSCOPY;  Surgeon: Carol Ada, MD;  Location: [facility] ENDOSCOPY;  Service: Endoscopy;  Laterality: Left;   LAPAROSCOPIC PARTIAL COLECTOMY N/A 07/19/2019   Procedure: LAPAROSCOPIC ASSISTED RESECTION OF SPLENIC FLEXURE COLON, MOBOLIZATION OF SPLENIC  FLEXURE;  Surgeon: Jovita Kussmaul, MD;  Location: [facility] ORS;  Service: General;  Laterality: N/A;    SOCIAL HISTORY: Social History   Socioeconomic History   Marital status: Single    Spouse name: Not on file   Number of children: Not on file   Years of education: Not on file   Highest education level: Not on file  Occupational History   Not on file  Social Needs   Financial resource strain: Not on file   Food insecurity    Worry: Not on file    Inability: Not on file   Transportation needs    Medical: Not on file    Non-medical: Not on file  Tobacco Use   Smoking status: Current Every Day Smoker    Packs/day: 1.00    Types: Cigarettes   Smokeless tobacco: Never Used  Substance and Sexual Activity   Alcohol use: Never    Frequency: Never   Drug use: Never   Sexual activity: Not on file  Lifestyle   Physical activity    Days per week: Not on file    Minutes per  session: Not on file   Stress: Not on file  Relationships   Social connections    Talks on phone: Not on file    Gets together: Not on file    Attends religious service: Not on file    Active member of club or organization: Not on file    Attends meetings of clubs or organizations: Not on file    Relationship status: Not on file   Intimate partner violence    Fear of current or ex partner: Not on file    Emotionally abused: Not on file    Physically abused: Not on file    Forced sexual activity: Not on file  Other Topics Concern   Not on file  Social History Narrative   Not on file    FAMILY HISTORY: Family History  Problem Relation Age of Onset   Stroke Father     ALLERGIES:  has No Known Allergies.  MEDICATIONS:  Current Outpatient Medications  Medication Sig Dispense Refill   cyclobenzaprine (FLEXERIL) 5 MG tablet Take 5 mg by mouth daily as needed for muscle spasms.     HYDROcodone-acetaminophen (NORCO/VICODIN) 5-325 MG tablet Take 1-2 tablets by mouth every 6 (six)  hours as needed for moderate pain or severe pain. 15 tablet 0   capecitabine (XELODA) 500 MG tablet Take 4 tablets (2,000 mg total) by mouth 2 (two) times daily after a meal for 14 days. Take on days 1-14 of chemotherapy. Repeat every 21 days. 112 tablet 3   dexamethasone (DECADRON) 4 MG tablet Take 2 tablets (8 mg total) by mouth daily. Start the day after chemotherapy for 2 days. Take with food. 30 tablet 1   lidocaine-prilocaine (EMLA) cream Apply to affected area once 30 g 3   LORazepam (ATIVAN) 0.5 MG tablet Take 1 tablet (0.5 mg total) by mouth every 6 (six) hours as needed (Nausea or vomiting). 30 tablet 0   ondansetron (ZOFRAN) 8 MG tablet Take 1 tablet (8 mg total) by mouth 2 (two) times daily as needed for refractory nausea / vomiting. Start on day 3 after chemotherapy. 30 tablet 1   prochlorperazine (COMPAZINE) 10 MG tablet Take 1 tablet (10 mg total) by mouth every 6 (six) hours as needed (Nausea or vomiting). 30 tablet 1   No current facility-administered medications for this visit.     REVIEW OF SYSTEMS:   Constitutional: ( - ) fevers, ( - )  chills , ( - ) night sweats Eyes: ( - ) blurriness of vision, ( - ) double vision, ( - ) watery eyes Ears, nose, mouth, throat, and face: ( - ) mucositis, ( - ) sore throat Respiratory: ( - ) cough, ( - ) dyspnea, ( - ) wheezes Cardiovascular: ( - ) palpitation, ( - ) chest discomfort, ( - ) lower extremity swelling Gastrointestinal:  ( - ) nausea, ( - ) heartburn, ( - ) change in bowel habits Skin: ( - ) abnormal skin rashes Lymphatics: ( - ) new lymphadenopathy, ( - ) easy bruising Neurological: ( - ) numbness, ( - ) tingling, ( - ) new weaknesses Behavioral/Psych: ( - ) mood change, ( - ) new changes  All other systems were reviewed with the patient and are negative.  PHYSICAL EXAMINATION: ECOG PERFORMANCE STATUS: 1 - Symptomatic but completely ambulatory  Vitals:   08/06/19 0955  BP: (!) 153/88  Pulse: 71  Resp: 18  Temp:  97.8 F (36.6 C)  SpO2: 100%   Filed Weights  08/06/19 0955  Weight: 172 lb 12.8 oz (78.4 kg)    GENERAL: alert, no distress and comfortable SKIN: skin color, texture, turgor are normal, no rashes or significant lesions EYES: conjunctiva are pink and non-injected, sclera clear OROPHARYNX: no exudate, no erythema; lips, buccal mucosa, and tongue normal  NECK: supple, non-tender LUNGS: clear to auscultation with normal breathing effort HEART: regular rate & rhythm, no murmurs, no lower extremity edema ABDOMEN: soft, non-tender, non-distended, normal bowel sounds, abdominal incision wound healing well  Musculoskeletal: no cyanosis of digits and no clubbing  PSYCH: alert & oriented x 3, fluent speech NEURO: no focal motor/sensory deficits  LABORATORY DATA:  I have reviewed the data as listed Lab Results  Component Value Date   WBC 5.6 08/06/2019   HGB 14.1 08/06/2019   HCT 43.6 08/06/2019   MCV 94.6 08/06/2019   PLT 388 08/06/2019   Lab Results  Component Value Date   NA 143 08/06/2019   K 4.1 08/06/2019   CL 104 08/06/2019   CO2 32 08/06/2019    RADIOGRAPHIC STUDIES: I have personally reviewed the radiological images as listed and agreed with the findings in the report. X-ray Chest Pa And Lateral  Result Date: 07/13/2019 CLINICAL DATA:  Preop. EXAM: CHEST - 2 VIEW COMPARISON:  Lung bases from abdominal CT earlier this day. FINDINGS: The cardiomediastinal contours are normal. Trace right pleural effusion on CT not well visualized. Mild right apically pleuroparenchymal scarring. Pulmonary vasculature is normal. No consolidation or pneumothorax. No acute osseous abnormalities are seen. IMPRESSION: Trace right pleural effusion on CT not well visualized radiographically. Mild right apical pleuroparenchymal scarring. Electronically Signed   By: Keith Rake M.D.   On: 07/13/2019 20:48   Ct Abdomen Pelvis W Contrast  Result Date: 07/13/2019 CLINICAL DATA:  Increasing  abdominal pain after eating with constipation, nausea and vomiting for 1 week. 10 pound weight loss in the past month. EXAM: CT ABDOMEN AND PELVIS WITH CONTRAST TECHNIQUE: Multidetector CT imaging of the abdomen and pelvis was performed using the standard protocol following bolus administration of intravenous contrast. CONTRAST:  100 mL OMNIPAQUE IOHEXOL 300 MG/ML  SOLN COMPARISON:  None. FINDINGS: Lower chest: Lung bases are clear. No pleural or pericardial effusion. Hepatobiliary: No focal liver abnormality is seen. No gallstones, gallbladder wall thickening, or biliary dilatation. Pancreas: Unremarkable. No pancreatic ductal dilatation or surrounding inflammatory changes. Spleen: Normal in size without focal abnormality. Adrenals/Urinary Tract: Adrenal glands are unremarkable. Kidneys are normal, without renal calculi, focal lesion, or hydronephrosis. Bladder is unremarkable. Stomach/Bowel: The patient has a stricturing mass lesion with an "apple-core" configuration just proximal to the splenic flexure of the colon as seen on image 12 of series 2. The lesion results in obstruction of the ascending and transverse colon and small bowel. Mild distention of the stomach is also seen. No CT signs of bowel ischemia. The appendix appears normal. Vascular/Lymphatic: No significant vascular findings are present. No enlarged abdominal or pelvic lymph nodes. Reproductive: Mild prostatomegaly. Other: None. Musculoskeletal: No lytic or sclerotic lesion. Multilevel lumbar facet degenerative change is noted. IMPRESSION: Mass lesion in the colon with an apple-core configuration just proximal to the splenic flexure is most consistent with carcinoma. The lesion results in obstruction of the ascending and transverse colon and small bowel. No metastatic disease identified. Mild prostatomegaly. These results were called by telephone at the time of interpretation on 07/13/2019 at 7:15 pm to Dr. Aletta Edouard , who verbally acknowledged  these results. Electronically Signed   By: Marcello Moores  Dalessio M.D.   On: 07/13/2019 19:18   Dg Ercp Biliary & Pancreatic Ducts  Result Date: 07/16/2019 CLINICAL DATA:  58 year old male with a history of colonic stent placement EXAM: COLONOSCOPY/SIGMOIDOSCOPY AND TREATMENT TECHNIQUE: Multiple spot images obtained with the fluoroscopic device and submitted for interpretation post-procedure. FLUOROSCOPY TIME:  Fluoroscopy Time:  3 minutes 10 seconds COMPARISON:  None. FINDINGS: Limited images during sigmoidoscopy/colonoscopy demonstrates placement of a palliative colonic stent. IMPRESSION: Limited images demonstrate placement of palliative colonic stent. Please refer to the dictated operative report for full details of intraoperative findings and procedure. Electronically Signed   By: Corrie Mckusick D.O.   On: 07/16/2019 12:41    PATHOLOGY: I have reviewed the pathology reports as documented in the oncologist history.

## 2019-08-06 ENCOUNTER — Encounter: Payer: Self-pay | Admitting: *Deleted

## 2019-08-06 ENCOUNTER — Other Ambulatory Visit: Payer: Self-pay | Admitting: Hematology

## 2019-08-06 ENCOUNTER — Inpatient Hospital Stay (HOSPITAL_BASED_OUTPATIENT_CLINIC_OR_DEPARTMENT_OTHER): Payer: BC Managed Care – PPO | Admitting: Hematology

## 2019-08-06 ENCOUNTER — Inpatient Hospital Stay: Payer: BC Managed Care – PPO | Attending: Hematology

## 2019-08-06 ENCOUNTER — Other Ambulatory Visit: Payer: Self-pay

## 2019-08-06 ENCOUNTER — Encounter: Payer: Self-pay | Admitting: Hematology

## 2019-08-06 VITALS — BP 153/88 | HR 71 | Temp 97.8°F | Resp 18 | Ht 72.0 in | Wt 172.8 lb

## 2019-08-06 DIAGNOSIS — Z8611 Personal history of tuberculosis: Secondary | ICD-10-CM

## 2019-08-06 DIAGNOSIS — F17210 Nicotine dependence, cigarettes, uncomplicated: Secondary | ICD-10-CM | POA: Insufficient documentation

## 2019-08-06 DIAGNOSIS — I10 Essential (primary) hypertension: Secondary | ICD-10-CM | POA: Insufficient documentation

## 2019-08-06 DIAGNOSIS — Z79899 Other long term (current) drug therapy: Secondary | ICD-10-CM | POA: Diagnosis not present

## 2019-08-06 DIAGNOSIS — C185 Malignant neoplasm of splenic flexure: Secondary | ICD-10-CM | POA: Diagnosis not present

## 2019-08-06 LAB — CBC WITH DIFFERENTIAL (CANCER CENTER ONLY)
Abs Immature Granulocytes: 0.02 10*3/uL (ref 0.00–0.07)
Basophils Absolute: 0 10*3/uL (ref 0.0–0.1)
Basophils Relative: 1 %
Eosinophils Absolute: 0.2 10*3/uL (ref 0.0–0.5)
Eosinophils Relative: 4 %
HCT: 43.6 % (ref 39.0–52.0)
Hemoglobin: 14.1 g/dL (ref 13.0–17.0)
Immature Granulocytes: 0 %
Lymphocytes Relative: 31 %
Lymphs Abs: 1.8 10*3/uL (ref 0.7–4.0)
MCH: 30.6 pg (ref 26.0–34.0)
MCHC: 32.3 g/dL (ref 30.0–36.0)
MCV: 94.6 fL (ref 80.0–100.0)
Monocytes Absolute: 0.6 10*3/uL (ref 0.1–1.0)
Monocytes Relative: 10 %
Neutro Abs: 3 10*3/uL (ref 1.7–7.7)
Neutrophils Relative %: 54 %
Platelet Count: 388 10*3/uL (ref 150–400)
RBC: 4.61 MIL/uL (ref 4.22–5.81)
RDW: 14.6 % (ref 11.5–15.5)
WBC Count: 5.6 10*3/uL (ref 4.0–10.5)
nRBC: 0 % (ref 0.0–0.2)

## 2019-08-06 LAB — CMP (CANCER CENTER ONLY)
ALT: 59 U/L — ABNORMAL HIGH (ref 0–44)
AST: 31 U/L (ref 15–41)
Albumin: 4.2 g/dL (ref 3.5–5.0)
Alkaline Phosphatase: 88 U/L (ref 38–126)
Anion gap: 7 (ref 5–15)
BUN: 7 mg/dL (ref 6–20)
CO2: 32 mmol/L (ref 22–32)
Calcium: 9.4 mg/dL (ref 8.9–10.3)
Chloride: 104 mmol/L (ref 98–111)
Creatinine: 0.97 mg/dL (ref 0.61–1.24)
GFR, Est AFR Am: >60 mL/min (ref >60)
GFR, Estimated: >60 mL/min (ref >60)
Glucose, Bld: 92 mg/dL (ref 70–99)
Potassium: 4.1 mmol/L (ref 3.5–5.1)
Sodium: 143 mmol/L (ref 135–145)
Total Bilirubin: 0.6 mg/dL (ref 0.3–1.2)
Total Protein: 7 g/dL (ref 6.5–8.1)

## 2019-08-06 LAB — CEA (IN HOUSE-CHCC): CEA (CHCC-In House): 5.78 ng/mL — ABNORMAL HIGH (ref 0.00–5.00)

## 2019-08-06 LAB — IRON AND TIBC
Iron: 57 ug/dL (ref 42–163)
Saturation Ratios: 17 % — ABNORMAL LOW (ref 20–55)
TIBC: 341 ug/dL (ref 202–409)
UIBC: 284 ug/dL (ref 117–376)

## 2019-08-06 LAB — FERRITIN: Ferritin: 72 ng/mL (ref 24–336)

## 2019-08-06 MED ORDER — CAPECITABINE 500 MG PO TABS: 1000.0000 mg/m2 | ORAL_TABLET | Freq: Two times a day (BID) | ORAL | 3 refills | Status: DC

## 2019-08-06 MED ORDER — LORAZEPAM 0.5 MG PO TABS: 0.5000 mg | ORAL_TABLET | Freq: Four times a day (QID) | ORAL | 0 refills | Status: DC | PRN

## 2019-08-06 MED ORDER — ONDANSETRON HCL 8 MG PO TABS: 8.0000 mg | ORAL_TABLET | Freq: Two times a day (BID) | ORAL | 1 refills | Status: DC | PRN

## 2019-08-06 MED ORDER — DEXAMETHASONE 4 MG PO TABS: 8.0000 mg | ORAL_TABLET | Freq: Every day | ORAL | 1 refills | Status: DC

## 2019-08-06 MED ORDER — LIDOCAINE-PRILOCAINE 2.5-2.5 % EX CREA: TOPICAL_CREAM | CUTANEOUS | 3 refills | Status: DC

## 2019-08-06 MED ORDER — PROCHLORPERAZINE MALEATE 10 MG PO TABS: 10.0000 mg | ORAL_TABLET | Freq: Four times a day (QID) | ORAL | 1 refills | Status: DC | PRN

## 2019-08-06 NOTE — Progress Notes (Signed)
START ON PATHWAY REGIMEN - Colorectal     A cycle is every 21 days:     Capecitabine      Oxaliplatin   **Always confirm dose/schedule in your pharmacy ordering system**  Patient Characteristics: Postoperative without Neoadjuvant Therapy (Pathologic Staging), Colon, Stage III, Low Risk (pT1-3, pN1) Tumor Location: Colon Therapeutic Status: Postoperative without Neoadjuvant Therapy (Pathologic Staging) AJCC M Category: cM0 AJCC T Category: pT3 AJCC N Category: pN1b AJCC 8 Stage Grouping: IIIB Intent of Therapy: Curative Intent, Discussed with Patient

## 2019-08-06 NOTE — Progress Notes (Signed)
Initial RN Navigator Patient Visit  Name: Billy Yates Date of Referral : 07/23/19 Diagnosis: Malignant neoplasm of splenic flexure ([place])  Met with patient prior to their visit with MD. [name] patient "Your Patient Navigator" handout which explains my role, areas in which I am able to help, and all the contact information for myself and the office. Also gave patient MD and Navigator business card. Reviewed with patient the general overview of expected course after initial diagnosis and time frame for all steps to be completed.  Spoke with patient about his transportation limitations. He states this is no longer an issue as he can now drive on his own. He also asks about working. Told him that some patients are able to work through treatment, but that he needed to discuss this in more detail with Dr Maylon Peppers.   New patient packet given to patient which includes: orientation to office and staff; campus directory; education on My Chart and Advance Directives; and patient centered education on colorectal cancer.  Patient completed visit with Dr. Maylon Peppers  Revisited with patient after MD visit. Patient will need  CT Chest: 08/09/19 8am Port Placement: 08/17/19 1pm arrival Chemo Education: 08/19/19   Patient requested that these appointments, including date/time/location and instructions be left on his voicemail. Information left as requested.   Patient understands all follow up procedures and expectations. They have my number to reach out for any further clarification or additional needs.

## 2019-08-09 ENCOUNTER — Encounter (HOSPITAL_BASED_OUTPATIENT_CLINIC_OR_DEPARTMENT_OTHER): Payer: Self-pay

## 2019-08-09 ENCOUNTER — Telehealth: Payer: Self-pay | Admitting: *Deleted

## 2019-08-09 ENCOUNTER — Ambulatory Visit (HOSPITAL_BASED_OUTPATIENT_CLINIC_OR_DEPARTMENT_OTHER)
Admission: RE | Admit: 2019-08-09 | Discharge: 2019-08-09 | Disposition: A | Discharge status: Home / Self Care | Payer: BC Managed Care – PPO | Source: Ambulatory Visit | Attending: Hematology | Admitting: Hematology

## 2019-08-09 ENCOUNTER — Telehealth: Payer: Self-pay | Admitting: Pharmacist

## 2019-08-09 ENCOUNTER — Ambulatory Visit: Payer: BC Managed Care – PPO | Admitting: Hematology

## 2019-08-09 ENCOUNTER — Other Ambulatory Visit: Payer: Self-pay

## 2019-08-09 ENCOUNTER — Encounter: Payer: Self-pay | Admitting: *Deleted

## 2019-08-09 ENCOUNTER — Other Ambulatory Visit: Payer: BC Managed Care – PPO

## 2019-08-09 ENCOUNTER — Telehealth: Payer: Self-pay | Admitting: Pharmacy Technician

## 2019-08-09 DIAGNOSIS — C185 Malignant neoplasm of splenic flexure: Secondary | ICD-10-CM | POA: Diagnosis not present

## 2019-08-09 LAB — QUANTIFERON-TB GOLD PLUS

## 2019-08-09 MED ORDER — CAPECITABINE 500 MG PO TABS: 1000.0000 mg/m2 | ORAL_TABLET | Freq: Two times a day (BID) | ORAL | 3 refills | Status: DC

## 2019-08-09 MED ORDER — IOHEXOL 300 MG/ML  SOLN
100.0000 mL | Freq: Once | INTRAMUSCULAR | Status: AC | PRN
  Administered 2019-08-09: 08:00:00 80 mL via INTRAVENOUS

## 2019-08-09 NOTE — Telephone Encounter (Signed)
Notified pt of CT scan results. Pt had the following concerns: 1. Can I do the pill only instead of the chemo and the pill? 2. What are the risks vs benefits of doing either option. 3 I would like to change the location to [facility] if possible. Discussed with pt I will forward his concerns and call with any additional information.

## 2019-08-09 NOTE — Progress Notes (Signed)
Received a message that patient had concerns about the planned treatment plan and wanted more clarification. Reached out to Dr Maylon Peppers and he called the patient. Received the following message. Will continue to follow for patient decision.      I talked with the patient this morning over the phone. I explained the rationale for CAPOX vs. Xeloda alone. He said he would talk to his family and make a decision and let us know in the next 2-3 days.   Thanks.   [place]    Patient also asked about moving care to the [facility] location because our location sent him two bills. Explained that this was not our office as he was just seen Friday, but likely the billing from his ED visit last month. Our billing structure and [facility] [place] is identical. Requested that Baxter Flattery, our financial counselor reach out to patient to see if she could clarify anything for him.

## 2019-08-09 NOTE — Telephone Encounter (Signed)
Oral Oncology Pharmacist Encounter  Received new prescription for Xeloda (capecitabine) for the treatment of Stage III colon cancer in conjunction with oxaliplatin, planned duration ~3 months.  CMP from 08/06/2019 assessed, no relevant lab abnormalities. Prescription dose and frequency assessed.   Current medication list in Epic reviewed, no relevant DDIs with capecitabine identified.  Due to insurance restriction the medication could not be filled at [place]. Prescription has been e-scribed to [facility].  Supportive information was faxed to [facility]. We will continue to follow medication access.   Oral Oncology Clinic will continue to follow for insurance authorization, copayment issues, initial counseling and start date.  Darl Pikes, PharmD, BCPS, [facility] Hematology/Oncology Clinical Pharmacist [facility]/AP Oral [facility] [phone]  08/09/2019 1:22 PM

## 2019-08-09 NOTE — Telephone Encounter (Signed)
-----   Message from Tish Men, MD sent at 08/09/2019  8:46 AM EDT ----- Graceann Congress,  Can you let Mr. Trotti know that his CT chest was negative for cancer, so we will proceed with chemotherapy as scheduled?He absolutely needs to quit smoking, as CT chest showed some lung damage from smoking.  Thanks.  [place]  ----- Message ----- From: Interface, Rad Results In Sent: 08/09/2019   8:40 AM EDT To: Tish Men, MD

## 2019-08-09 NOTE — Telephone Encounter (Signed)
Oral Oncology Patient Advocate Encounter  After completing a benefits investigation, prior authorization for Xeloda is not required at this time through [facility] [place].  Xeloda prescription must be filled with [facility].  Rx has been sent and I will follow up to check status and copayment.  [place] Patient [place] Phone [phone] Fax [phone] 08/09/2019 1:56 PM

## 2019-08-10 ENCOUNTER — Other Ambulatory Visit: Payer: Self-pay | Admitting: Hematology

## 2019-08-10 DIAGNOSIS — Z8611 Personal history of tuberculosis: Secondary | ICD-10-CM

## 2019-08-11 NOTE — Telephone Encounter (Signed)
Called to check the status of prescription from [facility].  Rep stated that the rx still needs to be verified by the pharmacist before calling the patient.  Patients copay is $150.  There is no grant funding available for his disease state.

## 2019-08-12 NOTE — Telephone Encounter (Signed)
08/11/2019:  I called Billy Yates and informed him of $150 copay for Xeloda.  He said he could not afford that monthly on top of his medical bills.  I offered to apply for assistance through [place].   He agreed and would like to move forward with applying. I emailed the patient application to him and he stated that he would fax it back to me.  Once I receive the signed physician and patient form I will fax to [place].

## 2019-08-13 ENCOUNTER — Encounter: Payer: Self-pay | Admitting: *Deleted

## 2019-08-13 NOTE — Progress Notes (Signed)
Attempting to call patient regarding his decision on whether or not he would like to proceed with systemic treatment. He is currently scheduled for a port on 08/17/19 and to start treatment on 08/19/19. Called at 11:00a and left a message on his voice mail requesting a call back.   Attempted phone contact at 1411. No response.   Dr Maylon Peppers notified of attempts. He also attempted to call patent and left a message.   Attempted phone contact at 1600. Made verbal contact at this time with patient. He does NOT want a port, therefor port placement appointment next week was cancelled. He will consider trying oxaliplatin peripherally (a possibility which was discussed) but he is concerned with side effects. After talking about side effect, patient feels like he wants to try at least one cycle of XELOX and see how he feels. If he tolerates he will continue.   Dr Maylon Peppers notified of update above. IR called and port placement appointment cancelled.

## 2019-08-17 ENCOUNTER — Other Ambulatory Visit (HOSPITAL_COMMUNITY): Payer: BC Managed Care – PPO

## 2019-08-17 ENCOUNTER — Ambulatory Visit (HOSPITAL_COMMUNITY): Payer: BC Managed Care – PPO

## 2019-08-17 ENCOUNTER — Other Ambulatory Visit: Payer: BC Managed Care – PPO

## 2019-08-17 NOTE — Telephone Encounter (Signed)
Faxed application to [employer] on 08/16/2019.  I will continue to check the status of his application.

## 2019-08-19 ENCOUNTER — Other Ambulatory Visit: Payer: Self-pay

## 2019-08-19 ENCOUNTER — Encounter: Payer: Self-pay | Admitting: Hematology

## 2019-08-19 ENCOUNTER — Other Ambulatory Visit: Payer: Self-pay | Admitting: *Deleted

## 2019-08-19 ENCOUNTER — Inpatient Hospital Stay: Payer: BC Managed Care – PPO

## 2019-08-19 ENCOUNTER — Other Ambulatory Visit: Payer: Self-pay | Admitting: Hematology

## 2019-08-19 ENCOUNTER — Encounter: Payer: Self-pay | Admitting: *Deleted

## 2019-08-19 ENCOUNTER — Telehealth: Payer: Self-pay | Admitting: Pharmacy Technician

## 2019-08-19 ENCOUNTER — Other Ambulatory Visit: Payer: BC Managed Care – PPO

## 2019-08-19 ENCOUNTER — Inpatient Hospital Stay (HOSPITAL_BASED_OUTPATIENT_CLINIC_OR_DEPARTMENT_OTHER): Payer: BC Managed Care – PPO | Admitting: Hematology

## 2019-08-19 VITALS — BP 161/85 | HR 72 | Temp 97.1°F | Resp 18 | Ht 72.0 in | Wt 175.1 lb

## 2019-08-19 DIAGNOSIS — R112 Nausea with vomiting, unspecified: Secondary | ICD-10-CM | POA: Diagnosis not present

## 2019-08-19 DIAGNOSIS — C185 Malignant neoplasm of splenic flexure: Secondary | ICD-10-CM

## 2019-08-19 DIAGNOSIS — Z8611 Personal history of tuberculosis: Secondary | ICD-10-CM | POA: Diagnosis not present

## 2019-08-19 LAB — CBC WITH DIFFERENTIAL (CANCER CENTER ONLY)
Abs Immature Granulocytes: 0.01 10*3/uL (ref 0.00–0.07)
Basophils Absolute: 0 10*3/uL (ref 0.0–0.1)
Basophils Relative: 1 %
Eosinophils Absolute: 0.3 10*3/uL (ref 0.0–0.5)
Eosinophils Relative: 6 %
HCT: 41.7 % (ref 39.0–52.0)
Hemoglobin: 13.5 g/dL (ref 13.0–17.0)
Immature Granulocytes: 0 %
Lymphocytes Relative: 31 %
Lymphs Abs: 1.8 10*3/uL (ref 0.7–4.0)
MCH: 30.3 pg (ref 26.0–34.0)
MCHC: 32.4 g/dL (ref 30.0–36.0)
MCV: 93.5 fL (ref 80.0–100.0)
Monocytes Absolute: 0.6 10*3/uL (ref 0.1–1.0)
Monocytes Relative: 11 %
Neutro Abs: 2.9 10*3/uL (ref 1.7–7.7)
Neutrophils Relative %: 51 %
Platelet Count: 222 10*3/uL (ref 150–400)
RBC: 4.46 MIL/uL (ref 4.22–5.81)
RDW: 14.1 % (ref 11.5–15.5)
WBC Count: 5.7 10*3/uL (ref 4.0–10.5)
nRBC: 0 % (ref 0.0–0.2)

## 2019-08-19 LAB — BASIC METABOLIC PANEL - CANCER CENTER ONLY
Anion gap: 7 (ref 5–15)
BUN: 7 mg/dL (ref 6–20)
CO2: 29 mmol/L (ref 22–32)
Calcium: 9.3 mg/dL (ref 8.9–10.3)
Chloride: 105 mmol/L (ref 98–111)
Creatinine: 1 mg/dL (ref 0.61–1.24)
GFR, Est AFR Am: >60 mL/min (ref >60)
GFR, Estimated: >60 mL/min (ref >60)
Glucose, Bld: 106 mg/dL — ABNORMAL HIGH (ref 70–99)
Potassium: 3.9 mmol/L (ref 3.5–5.1)
Sodium: 141 mmol/L (ref 135–145)

## 2019-08-19 NOTE — Telephone Encounter (Deleted)
Oral Oncology Patient Advocate Encounter  Received notification from [name] that patient has been successfully enrolled into their program to receive Xeloda from the manufacturer at $0 out of pocket until therapy discontinued, health insurance or financial situation changes, or the patient no longer meets program eligibility.   [name] will be contacting the patient to set up shipment of the medication.  Their phone number is [phone].  I called and spoke with patient.    Patient knows to call the office with questions or concerns.   Oral Oncology Clinic will continue to follow.  Billy Yates Patient [place] Phone [phone] Fax [phone] 08/19/2019 9:27 AM

## 2019-08-19 NOTE — Progress Notes (Unsigned)
quan

## 2019-08-19 NOTE — Telephone Encounter (Addendum)
Oral Oncology Patient Advocate Encounter  Received notification from [facility] that patient has been successfully enrolled into their program to receive Xeloda from the manufacturer at $0 out of pocket until therapy discontinued, health insurance or financial status changes, or the patient no longer meets program eligibility.    [name] will be contacting the patient to coordinate the first shipment of Xeloda.  Their number is [phone].  I called and had to leave a voice mail.  Patient knows to call the office with questions or concerns.   Oral Oncology Clinic will continue to follow.  [place] Patient [place] Phone [phone] Fax [phone] 08/19/2019 9:36 AM

## 2019-08-19 NOTE — Progress Notes (Signed)
[place] OFFICE PROGRESS NOTE  Patient Care Team: Patient, No Pcp Per as PCP - General (General Practice) Cordelia Poche, RN as Oncology Nurse Navigator Tish Men, MD as Medical Oncologist (Hematology)  HEME/ONC OVERVIEW: 1. Stage IIIB (pT3,pN1b,cMx) colon adenocarcinoma of splenic flexure, MMR proficient  -06/2019:   Colon mass proximal to splenic flexure on CT  Lap-assisted splenic flexure resection; path showed pT3pN1b adenocarcinoma of the colon, no LVI or PNI, 2/13 LN+  -Late 07/2019 - present: adjuvant CAPOX x 3 months   TREATMENT REGIMEN:  07/19/2019: lap-assisted splenic flexure resection  08/27/2019 - present: adjuvant CAPOX; plan for 3 months (ie 4 cycles)  PERTINENT NON-HEM/ONC PROBLEMS: 1. Hx of active TB, treated in 2018  ASSESSMENT & PLAN:   Stage IIIB (pT3,pN1b,cMx) colon adenocarcinoma of splenic flexure, MMR proficient  -Patient has had some hesitation regarding IV chemotherapy and his family has also been expressing some concern regarding IV chemotherapy -He declined port placement -I had a very lengthy discussion with the patient regarding the rationale for chemotherapy, the benefits of treatment -After the lengthy discussion, patient indicated that he would be willing to move forward cautiously with the combination chemotherapy, but if he develops any significant side effects, then he would change his treatment to Xeloda only -He had chemotherapy education today that reinforced some of his treatment-related side effects -We will tentatively start his 1st cycle of CAPOX on 08/27/2019 -PRN anti-emetics: Zofran, Compazine and dexamethasone   Hx of TB  -Treated with 6 months of TB therapy, completed in 2018  -Currently asymptomatic  -I have ordered TB gold quantiferon to assess for any active TB, as it would impact his ability to tolerate immunosuppressive treatment   Intermittent nausea -Etiology unclear; possibly residual post-op effect -No  associated abdominal pain, vomiting, hematochezia or melena  -Patient has been prescribed PRN anti-emetics and is encouraged to use them as needed  No orders of the defined types were placed in this encounter.  All questions were answered. The patient knows to call the clinic with any problems, questions or concerns. No barriers to learning was detected.  Return on 08/27/2019 for 1st cycle of CAPOX.  Return on 09/17/2019 for labs, port flush, clinic appt and Cycle 2 of CAPOX.   Tish Men, MD 08/19/2019 10:09 AM  CHIEF COMPLAINT: "I am doing okay"  INTERVAL HISTORY: Billy Yates returns to clinic for follow-up of Stage III colon cancer and discussion of adjuvant chemotherapy.  Patient had a lengthy discussion with his family after the last visit regarding adjuvant chemotherapy, and he and his family both had significant reservations regarding IV chemotherapy.  As a result, canceled his port placement.  He still has intermittent nausea sensation, but denies any vomiting, abdominal pain, diarrhea, hematochezia, or melena.  He has not taken any of the antiemetics that were prescribed.  He also reports mild abdominal pain around the abdominal incision site, but denies any abdominal pin or wound dehiscence.  He denies any other complaint today.   REVIEW OF SYSTEMS:   Constitutional: ( - ) fevers, ( - )  chills , ( - ) night sweats Eyes: ( - ) blurriness of vision, ( - ) double vision, ( - ) watery eyes Ears, nose, mouth, throat, and face: ( - ) mucositis, ( - ) sore throat Respiratory: ( - ) cough, ( - ) dyspnea, ( - ) wheezes Cardiovascular: ( - ) palpitation, ( - ) chest discomfort, ( - ) lower extremity swelling Gastrointestinal:  ( + )  nausea, ( - ) heartburn, ( - ) change in bowel habits Skin: ( - ) abnormal skin rashes Lymphatics: ( - ) new lymphadenopathy, ( - ) easy bruising Neurological: ( - ) numbness, ( - ) tingling, ( - ) new weaknesses Behavioral/Psych: ( - ) mood change, ( - ) new  changes  All other systems were reviewed with the patient and are negative.  SUMMARY OF ONCOLOGIC HISTORY: Oncology History  Colon cancer ([place])  07/13/2019 Imaging   CT abdomen/pelvis w/ contrast: IMPRESSION: Mass lesion in the colon with an apple-core configuration just proximal to the splenic flexure is most consistent with carcinoma. The lesion results in obstruction of the ascending and transverse colon and small bowel. No metastatic disease identified.   Mild prostatomegaly.   07/15/2019 Procedure   Flexible sigmoidoscopy: A fungating, infiltrative and ulcerated completely obstructing large mass was found in the distal transverse colon. The mass was circumferential. The mass measured five cm in length. No bleeding was present. This was stented with a 22 mm x 9 cm WallFlex stent. Estimated blood loss: none.   07/19/2019 Surgery   LAPAROSCOPIC ASSISTED RESECTION OF SPLENIC FLEXURE COLON, MOBOLIZATION OF SPLENIC FLEXURE (N/A)   07/19/2019 Pathology Results   Accession: [id number]  Procedure: Segmental resection Tumor Site: Tumor Size: 5.5 Macroscopic Tumor Perforation: Not identified Histologic Type: Adenocarcinoma Histologic Grade: G2, Moderately differentiated Tumor Extension: Tumor invades through the muscularis propria into pericolorectal tissue Margins: All margins are uninvolved by invasive carcinoma, high grade dysplasia / intramucosal carcinoma, and low grade dysplasia Treatment effect: No known presurgical therapy Lymphovascular Invasion: Present Perineural Invasion: Not identified Tumor Deposits: Not identified Regional Lymph Nodes: Number of Lymph Nodes Involved: 2 Number of Lymph Nodes Examined: 13 Pathologic Stage Classification (pTNM, AJCC 8th Edition): pT3, pN1b 1 of     07/19/2019 Cancer Staging   Staging form: Colon and Rectum, AJCC 8th Edition - Pathologic stage from 07/19/2019: Stage IIIB (pT3, pN1b, cM0) - Signed by Tish Men, MD on 08/05/2019    08/05/2019 Initial Diagnosis   Colon cancer ([place])   08/19/2019 -  Chemotherapy   The patient had palonosetron (ALOXI) injection 0.25 mg, 0.25 mg, Intravenous,  Once, 0 of 4 cycles oxaliplatin (ELOXATIN) 260 mg in dextrose 5 % 500 mL chemo infusion, 130 mg/m2, Intravenous,  Once, 0 of 4 cycles  for chemotherapy treatment.      I have reviewed the past medical history, past surgical history, social history and family history with the patient and they are unchanged from previous note.  ALLERGIES:  has No Known Allergies.  MEDICATIONS:  Current Outpatient Medications  Medication Sig Dispense Refill  . cyclobenzaprine (FLEXERIL) 5 MG tablet Take 5 mg by mouth daily as needed for muscle spasms.    Marland Kitchen HYDROcodone-acetaminophen (NORCO/VICODIN) 5-325 MG tablet Take 1-2 tablets by mouth every 6 (six) hours as needed for moderate pain or severe pain. 15 tablet 0  . LORazepam (ATIVAN) 0.5 MG tablet Take 1 tablet (0.5 mg total) by mouth every 6 (six) hours as needed (Nausea or vomiting). 30 tablet 0  . capecitabine (XELODA) 500 MG tablet Take 4 tablets (2,000 mg total) by mouth 2 (two) times daily after a meal. Take for 14 days then hold for 7 days. Repeat every 21 days. (Patient not taking: Reported on 08/19/2019) 112 tablet 3  . dexamethasone (DECADRON) 4 MG tablet Take 2 tablets (8 mg total) by mouth daily. Start the day after chemotherapy for 2 days. Take with food. (Patient not taking: Reported  on 08/19/2019) 30 tablet 1  . lidocaine-prilocaine (EMLA) cream Apply to affected area once (Patient not taking: Reported on 08/19/2019) 30 g 3  . ondansetron (ZOFRAN) 8 MG tablet Take 1 tablet (8 mg total) by mouth 2 (two) times daily as needed for refractory nausea / vomiting. Start on day 3 after chemotherapy. (Patient not taking: Reported on 08/19/2019) 30 tablet 1  . prochlorperazine (COMPAZINE) 10 MG tablet Take 1 tablet (10 mg total) by mouth every 6 (six) hours as needed (Nausea or vomiting). (Patient not  taking: Reported on 08/19/2019) 30 tablet 1   No current facility-administered medications for this visit.     PHYSICAL EXAMINATION: ECOG PERFORMANCE STATUS: 0 - Asymptomatic  Today's Vitals   08/19/19 0920  BP: (!) 161/85  Pulse: 72  Resp: 18  Temp: (!) 97.1 F (36.2 C)  TempSrc: Temporal  SpO2: 100%  Weight: 175 lb 1.9 oz (79.4 kg)  Height: 6' (1.829 m)  PainSc: 0-No pain   Body mass index is 23.75 kg/m.  Filed Weights   08/19/19 0920  Weight: 175 lb 1.9 oz (79.4 kg)    GENERAL: alert, no distress and comfortable SKIN: abdominal incision wound healing well without any evidence of dehiscence EYES: conjunctiva are pink and non-injected, sclera clear OROPHARYNX: no exudate, no erythema; lips, buccal mucosa, and tongue normal  NECK: supple, non-tender LUNGS: clear to auscultation with normal breathing effort HEART: regular rate & rhythm and no murmurs and no lower extremity edema ABDOMEN: soft, non-tender, non-distended, normal bowel sounds Musculoskeletal: no cyanosis of digits and no clubbing  PSYCH: alert & oriented x 3, fluent speech NEURO: no focal motor/sensory deficits  LABORATORY DATA:  I have reviewed the data as listed    Component Value Date/Time   NA 141 08/19/2019 0853   K 3.9 08/19/2019 0853   CL 105 08/19/2019 0853   CO2 29 08/19/2019 0853   GLUCOSE 106 (H) 08/19/2019 0853   BUN 7 08/19/2019 0853   CREATININE 1.00 08/19/2019 0853   CALCIUM 9.3 08/19/2019 0853   PROT 7.0 08/06/2019 0924   ALBUMIN 4.2 08/06/2019 0924   AST 31 08/06/2019 0924   ALT 59 (H) 08/06/2019 0924   ALKPHOS 88 08/06/2019 0924   BILITOT 0.6 08/06/2019 0924   GFRNONAA >60 08/19/2019 0853   GFRAA >60 08/19/2019 0853    No results found for: SPEP, UPEP  Lab Results  Component Value Date   WBC 5.7 08/19/2019   NEUTROABS 2.9 08/19/2019   HGB 13.5 08/19/2019   HCT 41.7 08/19/2019   MCV 93.5 08/19/2019   PLT 222 08/19/2019      Chemistry      Component Value  Date/Time   NA 141 08/19/2019 0853   K 3.9 08/19/2019 0853   CL 105 08/19/2019 0853   CO2 29 08/19/2019 0853   BUN 7 08/19/2019 0853   CREATININE 1.00 08/19/2019 0853      Component Value Date/Time   CALCIUM 9.3 08/19/2019 0853   ALKPHOS 88 08/06/2019 0924   AST 31 08/06/2019 0924   ALT 59 (H) 08/06/2019 0924   BILITOT 0.6 08/06/2019 0924       RADIOGRAPHIC STUDIES: I have personally reviewed the radiological images as listed below and agreed with the findings in the report. Ct Chest W Contrast  Result Date: 08/09/2019 CLINICAL DATA:  Stage III colon cancer status post resection. Evaluate for metastatic disease. EXAM: CT CHEST WITH CONTRAST TECHNIQUE: Multidetector CT imaging of the chest was performed during intravenous contrast administration.  CONTRAST:  51m OMNIPAQUE IOHEXOL 300 MG/ML  SOLN COMPARISON:  CT abdomen pelvis 07/13/2019. FINDINGS: Cardiovascular: Vascular structures are unremarkable. Heart size normal. No pericardial effusion. Mediastinum/Nodes: Triangular-shaped prevascular soft tissue is indicative of thymus. No pathologically enlarged mediastinal, hilar or axillary lymph nodes. Esophagus is grossly unremarkable. Lungs/Pleura: Bronchiectasis, architectural distortion and volume loss in the right upper lobe. Calcified granuloma in the right middle lobe. Lungs are otherwise clear. No pleural fluid. Airway is unremarkable. Upper Abdomen: Visualized portions of the liver, adrenal glands, kidneys, spleen, pancreas and stomach are unremarkable. Postoperative changes are seen in the colon. Musculoskeletal: No worrisome lytic or sclerotic lesions. IMPRESSION: 1. No evidence of metastatic disease in the chest. 2. Bronchiectasis, scarring and volume loss in the right upper lobe. Electronically Signed   By: MLorin PicketM.D.   On: 08/09/2019 08:38

## 2019-08-19 NOTE — Progress Notes (Unsigned)
Patient in chemotherapy education class with Elpidio Eric. Patient was in a hurry and needed to get to work so I  Briefly Discussed side effects of      Xeloda, 5FU which include but are not limited to myelosuppression, decreased appetite, fatigue, fever, allergic or infusional reaction, mucositis, cardiac toxicity, cough, SOB, altered taste, nausea and vomiting, diarrhea, constipation, elevated LFTs myalgia and arthralgias, hair loss or thinning, rash, skin dryness, nail changes, peripheral neuropathy, discolored urine, delayed wound healing, mental changes (Chemo brain), increased risk of infections, weight loss.  Reviewed infusion room and office policy and procedure and phone numbers 24 hours x 7 days a week.  Reviewed ambulatory pump specifics and how to manage safe handling at home.  Reviewed when to call the office with any concerns or problems.  Scientist, clinical (histocompatibility and immunogenetics) given.  Discussed portacath insertion and EMLA cream administration.  Antiemetic protocol and chemotherapy schedule reviewed. Patient verbalized understanding of chemotherapy indications and possible side effects.  Teachback done.  Told patient to call me on Mon or Tuesday with any questions since this was such a quick visit.

## 2019-08-20 ENCOUNTER — Encounter: Payer: Self-pay | Admitting: *Deleted

## 2019-08-20 NOTE — Progress Notes (Signed)
Patient had appointment yesterday to discuss initiation of treatment. He has been hesitant to give approval to move forward. After speaking to Dr Maylon Peppers and our nurse educator Lavina Hamman, the patient has agreed to start cycle one on 08/27/2019.   [facility] has been able to obtain his capecitabine for a $0 copay.   Will call patient next Wednesday to confirm continued agreement and see if patient has any other questions or concerns.

## 2019-08-21 LAB — QUANTIFERON-TB GOLD PLUS (RQFGPL)
QuantiFERON Mitogen Value: >10 IU/mL
QuantiFERON Nil Value: 0.05 IU/mL
QuantiFERON TB1 Ag Value: 0.84 IU/mL
QuantiFERON TB2 Ag Value: 1.09 IU/mL

## 2019-08-21 LAB — QUANTIFERON-TB GOLD PLUS: QuantiFERON-TB Gold Plus: POSITIVE — AB

## 2019-08-23 NOTE — Telephone Encounter (Signed)
Spoke to patient this afternoon.  He is going to call [name] to check on the status of his prescription. States that he starts treatment on Friday.  I told the patient to call me if there are any issues.

## 2019-08-24 ENCOUNTER — Encounter: Payer: Self-pay | Admitting: *Deleted

## 2019-08-25 ENCOUNTER — Encounter: Payer: Self-pay | Admitting: *Deleted

## 2019-08-25 NOTE — Progress Notes (Signed)
Called patient at 1310 to see if he had any questions or concerns about starting treatment on Friday, and confirm that he planned to be here. Left message on voice mail.   1545 - called and spoke to patient. He plans on coming to his appointment on Friday. He has no questions or concerns. He did ask about his Xeloda. I gave him [name] number and he will call them to follow up with the prescription.   Will follow back up with patient when he's here Friday.

## 2019-08-26 ENCOUNTER — Other Ambulatory Visit: Payer: Self-pay | Admitting: *Deleted

## 2019-08-26 NOTE — Telephone Encounter (Signed)
Patient stated that Xeloda would be delivered next Friday 10/9.  I called [name] to see if the date could be pushed up.  Kim, in Support, said that at earliest it could be delivered Tuesday, but it does require a signature.  They do offer it being delivered to the doctors office.  Alyson, pharmacist, called the [facility] office to see if she could get some information about his start date but had to leave a voicemail.  I left the delivery date as is.  I wasn't sure if the patient would be home to sign for the delivery since he works during the day.

## 2019-08-27 ENCOUNTER — Inpatient Hospital Stay: Payer: BC Managed Care – PPO | Attending: Hematology

## 2019-08-27 ENCOUNTER — Inpatient Hospital Stay: Payer: BC Managed Care – PPO

## 2019-08-27 ENCOUNTER — Other Ambulatory Visit: Payer: BC Managed Care – PPO

## 2019-08-27 ENCOUNTER — Encounter: Payer: Self-pay | Admitting: *Deleted

## 2019-08-27 ENCOUNTER — Other Ambulatory Visit: Payer: Self-pay

## 2019-08-27 ENCOUNTER — Other Ambulatory Visit: Payer: Self-pay | Admitting: Hematology

## 2019-08-27 VITALS — BP 147/89 | HR 71 | Temp 97.8°F | Resp 17

## 2019-08-27 DIAGNOSIS — Z5111 Encounter for antineoplastic chemotherapy: Secondary | ICD-10-CM | POA: Insufficient documentation

## 2019-08-27 DIAGNOSIS — C185 Malignant neoplasm of splenic flexure: Secondary | ICD-10-CM

## 2019-08-27 LAB — CBC WITH DIFFERENTIAL (CANCER CENTER ONLY)
Abs Immature Granulocytes: 0.01 10*3/uL (ref 0.00–0.07)
Basophils Absolute: 0 10*3/uL (ref 0.0–0.1)
Basophils Relative: 1 %
Eosinophils Absolute: 0.3 10*3/uL (ref 0.0–0.5)
Eosinophils Relative: 6 %
HCT: 42.9 % (ref 39.0–52.0)
Hemoglobin: 14.2 g/dL (ref 13.0–17.0)
Immature Granulocytes: 0 %
Lymphocytes Relative: 29 %
Lymphs Abs: 1.8 10*3/uL (ref 0.7–4.0)
MCH: 30.4 pg (ref 26.0–34.0)
MCHC: 33.1 g/dL (ref 30.0–36.0)
MCV: 91.9 fL (ref 80.0–100.0)
Monocytes Absolute: 0.6 10*3/uL (ref 0.1–1.0)
Monocytes Relative: 9 %
Neutro Abs: 3.4 10*3/uL (ref 1.7–7.7)
Neutrophils Relative %: 55 %
Platelet Count: 285 10*3/uL (ref 150–400)
RBC: 4.67 MIL/uL (ref 4.22–5.81)
RDW: 14.2 % (ref 11.5–15.5)
WBC Count: 6.1 10*3/uL (ref 4.0–10.5)
nRBC: 0 % (ref 0.0–0.2)

## 2019-08-27 LAB — BASIC METABOLIC PANEL - CANCER CENTER ONLY
Anion gap: 8 (ref 5–15)
BUN: 9 mg/dL (ref 6–20)
CO2: 27 mmol/L (ref 22–32)
Calcium: 9.3 mg/dL (ref 8.9–10.3)
Chloride: 107 mmol/L (ref 98–111)
Creatinine: 0.96 mg/dL (ref 0.61–1.24)
GFR, Est AFR Am: >60 mL/min (ref >60)
GFR, Estimated: >60 mL/min (ref >60)
Glucose, Bld: 162 mg/dL — ABNORMAL HIGH (ref 70–99)
Potassium: 3.7 mmol/L (ref 3.5–5.1)
Sodium: 142 mmol/L (ref 135–145)

## 2019-08-27 MED ORDER — DEXAMETHASONE SODIUM PHOSPHATE 10 MG/ML IJ SOLN
10.0000 mg | Freq: Once | INTRAMUSCULAR | Status: AC
  Administered 2019-08-27: 10 mg via INTRAVENOUS

## 2019-08-27 MED ORDER — PALONOSETRON HCL INJECTION 0.25 MG/5ML
INTRAVENOUS | Status: AC
  Filled 2019-08-27: qty 5

## 2019-08-27 MED ORDER — PALONOSETRON HCL INJECTION 0.25 MG/5ML
0.2500 mg | Freq: Once | INTRAVENOUS | Status: AC
  Administered 2019-08-27: 10:00:00 0.25 mg via INTRAVENOUS

## 2019-08-27 MED ORDER — DEXAMETHASONE SODIUM PHOSPHATE 10 MG/ML IJ SOLN
INTRAMUSCULAR | Status: AC
  Filled 2019-08-27: qty 1

## 2019-08-27 MED ORDER — DEXTROSE 5 % IV SOLN
Freq: Once | INTRAVENOUS | Status: AC
  Administered 2019-08-27: 09:00:00 via INTRAVENOUS
  Filled 2019-08-27: qty 250

## 2019-08-27 MED ORDER — OXALIPLATIN CHEMO INJECTION 100 MG/20ML
130.0000 mg/m2 | Freq: Once | INTRAVENOUS | Status: AC
  Administered 2019-08-27: 260 mg via INTRAVENOUS
  Filled 2019-08-27: qty 52

## 2019-08-27 NOTE — Progress Notes (Signed)
Patient is here to start treatment. He is still very nervous about how this will go but says he will try this first cycle and see how it makes him feel. He intentionally scheduled on Friday so that he could rest over the weekend. He states his job is flexible so he can also take it easy early next week. Overall he's trying to be optimistic about this cycle and hopes he can continue.   His Xeloda is scheduled to be delivered next Friday, 10/9. I have sent a message to Dr Maylon Peppers to update him on the difference in start dates.

## 2019-08-27 NOTE — Progress Notes (Addendum)
Received message from Dr Maylon Peppers to change patient's future infusions to align with Xeloda start.   let's reschedule his infusion, labs, port flush, and clinic appt from 10/23 to 10/30, so that he will start the 2nd cycle of Xeloda and oxaliplatin on the same day  Message sent to scheduler to make edits. Reviewed change with patient. Gave him a October calender to outline new systemic treatment appointments but also to show when he will take the Xeloda.

## 2019-08-27 NOTE — Patient Instructions (Signed)
[facility] Discharge Instructions for Patients Receiving Chemotherapy  Today you received the following chemotherapy agents:  Oxaliplatin and Capecitabine  To help prevent nausea and vomiting after your treatment, we encourage you to take your nausea medications as directed on the medication bottles.   If you develop nausea and vomiting that is not controlled by your nausea medication, call the clinic.   BELOW ARE SYMPTOMS THAT SHOULD BE REPORTED IMMEDIATELY:  *FEVER GREATER THAN 100.5 F  *CHILLS WITH OR WITHOUT FEVER  NAUSEA AND VOMITING THAT IS NOT CONTROLLED WITH YOUR NAUSEA MEDICATION  *UNUSUAL SHORTNESS OF BREATH  *UNUSUAL BRUISING OR BLEEDING  TENDERNESS IN MOUTH AND THROAT WITH OR WITHOUT PRESENCE OF ULCERS  *URINARY PROBLEMS  *BOWEL PROBLEMS  UNUSUAL RASH Items with * indicate a potential emergency and should be followed up as soon as possible.  Feel free to call the clinic should you have any questions or concerns. The clinic phone number is [phone].  Please show the [facility] at check-in to the Emergency Department and triage nurse.

## 2019-08-30 ENCOUNTER — Other Ambulatory Visit: Payer: Self-pay | Admitting: Hematology

## 2019-09-02 ENCOUNTER — Telehealth: Payer: Self-pay | Admitting: Hematology

## 2019-09-02 NOTE — Telephone Encounter (Signed)
Appointments scheduled patient declined calendar due to My Chart per 10/8 los

## 2019-09-09 ENCOUNTER — Ambulatory Visit: Payer: BC Managed Care – PPO

## 2019-09-09 ENCOUNTER — Other Ambulatory Visit: Payer: BC Managed Care – PPO

## 2019-09-09 ENCOUNTER — Ambulatory Visit: Payer: BC Managed Care – PPO | Admitting: Hematology

## 2019-09-17 ENCOUNTER — Ambulatory Visit: Payer: BC Managed Care – PPO | Admitting: Hematology

## 2019-09-17 ENCOUNTER — Other Ambulatory Visit: Payer: BC Managed Care – PPO

## 2019-09-17 ENCOUNTER — Ambulatory Visit: Payer: BC Managed Care – PPO

## 2019-09-24 ENCOUNTER — Inpatient Hospital Stay: Payer: BC Managed Care – PPO | Admitting: Hematology

## 2019-09-24 ENCOUNTER — Inpatient Hospital Stay: Payer: BC Managed Care – PPO

## 2019-09-24 ENCOUNTER — Ambulatory Visit: Payer: BC Managed Care – PPO

## 2019-09-24 ENCOUNTER — Telehealth: Payer: Self-pay | Admitting: *Deleted

## 2019-09-24 ENCOUNTER — Other Ambulatory Visit: Payer: BC Managed Care – PPO

## 2019-09-24 NOTE — Telephone Encounter (Signed)
Attempt to reach pt concerning today's appt. Unable to reach pt.  Cell- recording "VM is full." Home-recording "Non working number" Work # - male advised " no one by that name here"

## 2019-09-28 ENCOUNTER — Encounter: Payer: Self-pay | Admitting: *Deleted

## 2019-09-28 NOTE — Progress Notes (Signed)
Patient was a no show to his MD and treatment appointment last week. Multiple attempt were made last week to reach patient which we unsuccessful.  Called patient today on his home phone and I was able to leave a voicemail requesting that he call back. Will await his return call. If I do not hear from him, I will reach out to his emergency contact.

## 2019-09-30 ENCOUNTER — Ambulatory Visit: Payer: BC Managed Care – PPO | Admitting: Family

## 2019-09-30 ENCOUNTER — Ambulatory Visit: Payer: BC Managed Care – PPO

## 2019-09-30 ENCOUNTER — Other Ambulatory Visit: Payer: BC Managed Care – PPO

## 2019-10-01 ENCOUNTER — Encounter: Payer: Self-pay | Admitting: *Deleted

## 2019-10-01 NOTE — Progress Notes (Signed)
Continued attempts to reach patient have been unsuccessful. Patient called and another message left on his voicemail on 09/30/2019 without a call back.  This morning, at 10:45, call placed to his contact, his aunt Thelma Comp. No answer at that number, but message left, again requesting a call back.  Letter placed in mail today, addressed to patient home. Will await response.

## 2019-10-15 ENCOUNTER — Inpatient Hospital Stay: Payer: BC Managed Care – PPO

## 2019-10-15 ENCOUNTER — Inpatient Hospital Stay: Payer: BC Managed Care – PPO | Admitting: Hematology

## 2019-10-19 ENCOUNTER — Ambulatory Visit: Payer: BC Managed Care – PPO

## 2019-10-19 ENCOUNTER — Other Ambulatory Visit: Payer: BC Managed Care – PPO

## 2019-10-19 ENCOUNTER — Ambulatory Visit: Payer: BC Managed Care – PPO | Admitting: Hematology

## 2019-11-05 ENCOUNTER — Ambulatory Visit: Payer: BC Managed Care – PPO

## 2019-11-05 ENCOUNTER — Other Ambulatory Visit: Payer: BC Managed Care – PPO

## 2019-11-05 ENCOUNTER — Ambulatory Visit: Payer: BC Managed Care – PPO | Admitting: Family

## 2020-06-06 ENCOUNTER — Encounter: Payer: Self-pay | Admitting: *Deleted

## 2020-06-06 NOTE — Progress Notes (Signed)
On 06/02/2020 patient came into the office to request a follow up appointment. This patient was seen in 2020 by Dr Maylon Peppers and had one treatment. After this first treatment we were unable to reach him by phone, through his emergency contact, or via mail. He states he was told chemo was not absolutely necessary and he stopped treatment. He would like to come in and be worked up to make sure "Everything is still okay".  Appointment made with Dr Marin Olp for next week. Patient understands that Dr Maylon Peppers is no longer with this office and he will see new physician.   Oncology Nurse Navigator Documentation  Oncology Nurse Navigator Flowsheets 06/06/2020  Abnormal Finding Date -  Confirmed Diagnosis Date -  Diagnosis Status -  Planned Course of Treatment -  Phase of Treatment -  Chemotherapy Pending- Reason: -  Chemotherapy Actual Start Date: -  Expected Surgery Date -  Navigator Follow Up Date: 06/13/2020  Navigator Follow Up Reason: Follow-up Appointment  Navigator Location [facility]  Referral Date to RadOnc/MedOnc -  Navigator Encounter Type Appt/Treatment Plan Review;Telephone  Telephone Appt Confirmation/Clarification;Outgoing Call  Treatment Initiated Date 08/27/2019  Patient Visit Type MedOnc  Treatment Phase Other  Barriers/Navigation Needs Coordination of Care;Education;Health Literacy;Healthcare System Knowledge Deficit;Personal Conflicts  Education Other  Interventions Coordination of Care;Education  Acuity Level 4-High Needs (Greater Than 4 Barriers Identified)  Referrals -  Coordination of Care Appts  Education Method Verbal  Time Spent with Patient 2

## 2020-06-13 ENCOUNTER — Inpatient Hospital Stay (HOSPITAL_BASED_OUTPATIENT_CLINIC_OR_DEPARTMENT_OTHER): Payer: BC Managed Care – PPO | Admitting: Hematology & Oncology

## 2020-06-13 ENCOUNTER — Other Ambulatory Visit: Payer: Self-pay | Admitting: *Deleted

## 2020-06-13 ENCOUNTER — Encounter: Payer: Self-pay | Admitting: *Deleted

## 2020-06-13 ENCOUNTER — Inpatient Hospital Stay: Payer: BC Managed Care – PPO | Attending: Hematology & Oncology

## 2020-06-13 ENCOUNTER — Other Ambulatory Visit: Payer: Self-pay

## 2020-06-13 VITALS — BP 155/80 | HR 71 | Temp 99.0°F | Resp 18 | Wt 194.0 lb

## 2020-06-13 DIAGNOSIS — Z79899 Other long term (current) drug therapy: Secondary | ICD-10-CM | POA: Insufficient documentation

## 2020-06-13 DIAGNOSIS — C186 Malignant neoplasm of descending colon: Secondary | ICD-10-CM

## 2020-06-13 DIAGNOSIS — Z7952 Long term (current) use of systemic steroids: Secondary | ICD-10-CM | POA: Insufficient documentation

## 2020-06-13 DIAGNOSIS — C778 Secondary and unspecified malignant neoplasm of lymph nodes of multiple regions: Secondary | ICD-10-CM | POA: Insufficient documentation

## 2020-06-13 DIAGNOSIS — R112 Nausea with vomiting, unspecified: Secondary | ICD-10-CM

## 2020-06-13 DIAGNOSIS — C185 Malignant neoplasm of splenic flexure: Secondary | ICD-10-CM | POA: Insufficient documentation

## 2020-06-13 LAB — CBC WITH DIFFERENTIAL (CANCER CENTER ONLY)
Abs Immature Granulocytes: 0.01 10*3/uL (ref 0.00–0.07)
Basophils Absolute: 0 10*3/uL (ref 0.0–0.1)
Basophils Relative: 1 %
Eosinophils Absolute: 0.2 10*3/uL (ref 0.0–0.5)
Eosinophils Relative: 3 %
HCT: 44.8 % (ref 39.0–52.0)
Hemoglobin: 14.9 g/dL (ref 13.0–17.0)
Immature Granulocytes: 0 %
Lymphocytes Relative: 34 %
Lymphs Abs: 2.1 10*3/uL (ref 0.7–4.0)
MCH: 30.2 pg (ref 26.0–34.0)
MCHC: 33.3 g/dL (ref 30.0–36.0)
MCV: 90.7 fL (ref 80.0–100.0)
Monocytes Absolute: 0.5 10*3/uL (ref 0.1–1.0)
Monocytes Relative: 8 %
Neutro Abs: 3.3 10*3/uL (ref 1.7–7.7)
Neutrophils Relative %: 54 %
Platelet Count: 211 10*3/uL (ref 150–400)
RBC: 4.94 MIL/uL (ref 4.22–5.81)
RDW: 14.6 % (ref 11.5–15.5)
WBC Count: 6.1 10*3/uL (ref 4.0–10.5)
nRBC: 0 % (ref 0.0–0.2)

## 2020-06-13 LAB — CMP (CANCER CENTER ONLY)
ALT: 35 U/L (ref 0–44)
AST: 36 U/L (ref 15–41)
Albumin: 4.5 g/dL (ref 3.5–5.0)
Alkaline Phosphatase: 72 U/L (ref 38–126)
Anion gap: 10 (ref 5–15)
BUN: 10 mg/dL (ref 6–20)
CO2: 27 mmol/L (ref 22–32)
Calcium: 9.5 mg/dL (ref 8.9–10.3)
Chloride: 105 mmol/L (ref 98–111)
Creatinine: 1.13 mg/dL (ref 0.61–1.24)
GFR, Est AFR Am: >60 mL/min (ref >60)
GFR, Estimated: >60 mL/min (ref >60)
Glucose, Bld: 118 mg/dL — ABNORMAL HIGH (ref 70–99)
Potassium: 3.5 mmol/L (ref 3.5–5.1)
Sodium: 142 mmol/L (ref 135–145)
Total Bilirubin: 1.3 mg/dL — ABNORMAL HIGH (ref 0.3–1.2)
Total Protein: 7.6 g/dL (ref 6.5–8.1)

## 2020-06-13 NOTE — Progress Notes (Signed)
Hematology and Oncology Follow Up Visit  Billy Yates [id number] 02-24-1961 59 y.o. 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  He has been followed by [facility].  He had colonic surgery back in August 2020.  He had a tumor resected.  He had 2 of 13 lymph nodes positive.  I think you had 1 cycle of treatment.  He was supposed to get CAPEOX, but only took 1 dose of the oxaliplatin.  I am not sure why he did not take any further treatment.  Apparently his girlfriend is undergoing chemotherapy.  Sound like she has metastatic colon cancer.  She is being treated at [facility].  He feels well.  He has had no abdominal pain.  He is going to the bathroom without difficulty.  He has had no problems with cough or shortness of breath.  He is working.  He works for a [employer].  They distributed automotive glass to various factories.  He has had no bleeding.  There is no fever.  He is not he has coronavirus vaccine but says he will get this this week.  He has had no leg swelling.  There is been no rashes.  He has had no bleeding.  Overall, his performance status is ECOG 0.  He does smoke.  Smokes about half pack every 3 days.  Of note, he was in the [facility].  He is in [employer] for a couple years.    Medications:  Current Outpatient Medications:  .  capecitabine (XELODA) 500 MG tablet, Take 4 tablets (2,000 mg total) by mouth 2 (two) times daily after a meal. Take for 14 days then hold for 7 days. Repeat every 21 days. (Patient not taking: Reported on 08/19/2019), Disp: 112 tablet, Rfl: 3 .  cyclobenzaprine (FLEXERIL) 5 MG tablet, Take 5 mg by mouth daily as needed for muscle spasms., Disp: , Rfl:  .  dexamethasone (DECADRON) 4 MG tablet, Take 2 tablets (8 mg total) by mouth daily. Start the day after chemotherapy  for 2 days. Take with food. (Patient not taking: Reported on 08/19/2019), Disp: 30 tablet, Rfl: 1 .  HYDROcodone-acetaminophen (NORCO/VICODIN) 5-325 MG tablet, Take 1-2 tablets by mouth every 6 (six) hours as needed for moderate pain or severe pain., Disp: 15 tablet, Rfl: 0 .  lidocaine-prilocaine (EMLA) cream, Apply to affected area once (Patient not taking: Reported on 08/19/2019), Disp: 30 g, Rfl: 3 .  LORazepam (ATIVAN) 0.5 MG tablet, Take 1 tablet (0.5 mg total) by mouth every 6 (six) hours as needed (Nausea or vomiting)., Disp: 30 tablet, Rfl: 0 .  ondansetron (ZOFRAN) 8 MG tablet, Take 1 tablet (8 mg total) by mouth 2 (two) times daily as needed for refractory nausea / vomiting. Start on day 3 after chemotherapy. (Patient not taking: Reported on 08/19/2019), Disp: 30 tablet, Rfl: 1 .  prochlorperazine (COMPAZINE) 10 MG tablet, Take 1 tablet (10 mg total) by mouth every 6 (six) hours as needed (Nausea or vomiting). (Patient not taking: Reported on 08/19/2019), Disp: 30 tablet, Rfl: 1  Allergies: No Known Allergies  Past Medical History, Surgical history, Social history, and Family History were reviewed and updated.  Review of Systems: Review of Systems  Constitutional: Negative.   HENT:  Negative.   Eyes: Negative.   Respiratory: Negative.   Cardiovascular: Negative.  Gastrointestinal: Negative.   Endocrine: Negative.   Genitourinary: Negative.    Musculoskeletal: Negative.   Skin: Negative.   Neurological: Negative.   Hematological: Negative.   Psychiatric/Behavioral: Negative.     Physical Exam:  weight is 194 lb (88 kg). His oral temperature is 99 F (37.2 C). His blood pressure is 155/80 (abnormal) and his pulse is 71. His respiration is 18 and oxygen saturation is 100%.   Wt Readings from Last 3 Encounters:  06/13/20 194 lb (88 kg)  08/19/19 175 lb 1.9 oz (79.4 kg)  08/06/19 172 lb 12.8 oz (78.4 kg)    Physical Exam Vitals reviewed.  HENT:     Head: Normocephalic and  atraumatic.  Eyes:     Pupils: Pupils are equal, round, and reactive to light.  Cardiovascular:     Rate and Rhythm: Normal rate and regular rhythm.     Heart sounds: Normal heart sounds.  Pulmonary:     Effort: Pulmonary effort is normal.     Breath sounds: Normal breath sounds.  Abdominal:     General: Bowel sounds are normal.     Palpations: Abdomen is soft.  Musculoskeletal:        General: No tenderness or deformity. Normal range of motion.     Cervical back: Normal range of motion.  Lymphadenopathy:     Cervical: No cervical adenopathy.  Skin:    General: Skin is warm and dry.     Findings: No erythema or rash.  Neurological:     Mental Status: He is alert and oriented to person, place, and time.  Psychiatric:        Behavior: Behavior normal.        Thought Content: Thought content normal.        Judgment: Judgment normal.      Lab Results  Component Value Date   WBC 6.1 06/13/2020   HGB 14.9 06/13/2020   HCT 44.8 06/13/2020   MCV 90.7 06/13/2020   PLT 211 06/13/2020     Chemistry      Component Value Date/Time   NA 142 06/13/2020 1500   K 3.5 06/13/2020 1500   CL 105 06/13/2020 1500   CO2 27 06/13/2020 1500   BUN 10 06/13/2020 1500   CREATININE 1.13 06/13/2020 1500      Component Value Date/Time   CALCIUM 9.5 06/13/2020 1500   ALKPHOS 72 06/13/2020 1500   AST 36 06/13/2020 1500   ALT 35 06/13/2020 1500   BILITOT 1.3 (H) 06/13/2020 1500      Impression and Plan: Billy Yates is a very nice 59 year old African-American male.  He has a history of stage IIIb colon cancer.  This is of the splenic flexure.  He underwent resection.  He had essentially really no adjuvant chemotherapy.  He is supposed to have adjuvant chemotherapy but somehow he really did not wish to undergo this.  I think we probably have to set him up with scans so we can see how everything looks.  I will set him up with a CT scan of the chest/abdomen/pelvis and we will see how everything  looks.  Hopefully, we can just do scans on him couple times a year.  We will try to make things as simple as possible.  Again, he served our country.  We will do our best to make sure that he gets the best care possible.  I will plan to see him back in about 3 or 4 months.  If the CT scan  does show a something that is suspicious, we will get him back sooner.  I spent about 45 minutes with him today.  This was essentially new patient visit for me since I had not seen him previously.   [name], MD 7/20/20214:26 PM

## 2020-06-13 NOTE — Progress Notes (Signed)
Visited with patient prior to his appointment with Dr Marin Olp.  Patient states after his first treatment, he felt like it was optional. He stated the first treatment was difficult and he had side effects at the end of the infusion that he felt was related to his decision not to get a port. He says that he got an appointment reminder which he declined and thought that we would reach out to him. I explained to him that I made several calls and sent a letter.   Reviewed the importance of keeping all follow up appointments and scans. Explained that if the patient didn't want treatment, that was absolutely his choice, but that he still needed to come to follow up appointments as recommended and have scans on regular intervals. He stated understanding.   Visited with Dr Marin Olp. He will need restaging scans. Will work on scheduling these once insurance Josem Kaufmann is obtained.

## 2020-06-14 ENCOUNTER — Telehealth: Payer: Self-pay | Admitting: Hematology & Oncology

## 2020-06-14 LAB — CEA (IN HOUSE-CHCC): CEA (CHCC-In House): 4.48 ng/mL (ref 0.00–5.00)

## 2020-06-14 LAB — LACTATE DEHYDROGENASE: LDH: 221 U/L — ABNORMAL HIGH (ref 98–192)

## 2020-06-14 NOTE — Telephone Encounter (Signed)
Appointments scheduled per 7/20 los calendar was mailed to home address

## 2020-06-15 ENCOUNTER — Encounter: Payer: Self-pay | Admitting: *Deleted

## 2020-06-15 ENCOUNTER — Other Ambulatory Visit: Payer: Self-pay | Admitting: Hematology & Oncology

## 2020-06-15 DIAGNOSIS — C186 Malignant neoplasm of descending colon: Secondary | ICD-10-CM

## 2020-06-15 NOTE — Progress Notes (Signed)
CTs have received authorization. Spoke to radiology and they reached out to patient to schedule CTs for 06/26/20. Scheduled at this time due to patient work schedule.   Oncology Nurse Navigator Documentation  Oncology Nurse Navigator Flowsheets 06/15/2020  Abnormal Finding Date -  Confirmed Diagnosis Date -  Diagnosis Status -  Planned Course of Treatment -  Phase of Treatment -  Chemotherapy Pending- Reason: -  Chemotherapy Actual Start Date: -  Expected Surgery Date -  Navigator Follow Up Date: 06/26/2020  Navigator Follow Up Reason: Radiology  Navigator Location [facility]  Referral Date to RadOnc/MedOnc -  Navigator Encounter Type Appt/Treatment Plan Review  Telephone -  Treatment Initiated Date -  Patient Visit Type MedOnc  Treatment Phase Other  Barriers/Navigation Needs Coordination of Care  Education -  Interventions Coordination of Care  Acuity Level 4-High Needs (Greater Than 4 Barriers Identified)  Referrals -  Coordination of Care Appts;Radiology  Education Method -  Time Spent with Patient 30

## 2020-06-26 ENCOUNTER — Encounter: Payer: Self-pay | Admitting: Hematology & Oncology

## 2020-06-26 ENCOUNTER — Ambulatory Visit (HOSPITAL_BASED_OUTPATIENT_CLINIC_OR_DEPARTMENT_OTHER)
Admission: RE | Admit: 2020-06-26 | Discharge: 2020-06-26 | Disposition: A | Discharge status: Home / Self Care | Payer: BC Managed Care – PPO | Source: Ambulatory Visit | Attending: Hematology & Oncology | Admitting: Hematology & Oncology

## 2020-06-26 ENCOUNTER — Ambulatory Visit (HOSPITAL_BASED_OUTPATIENT_CLINIC_OR_DEPARTMENT_OTHER): Payer: BC Managed Care – PPO

## 2020-06-26 ENCOUNTER — Other Ambulatory Visit: Payer: Self-pay

## 2020-06-26 DIAGNOSIS — C186 Malignant neoplasm of descending colon: Secondary | ICD-10-CM | POA: Diagnosis present

## 2020-06-26 MED ORDER — IOHEXOL 300 MG/ML  SOLN
100.0000 mL | Freq: Once | INTRAMUSCULAR | Status: AC | PRN
  Administered 2020-06-26: 100 mL via INTRAVENOUS

## 2020-06-27 ENCOUNTER — Other Ambulatory Visit: Payer: Self-pay | Admitting: Family

## 2020-07-03 ENCOUNTER — Encounter: Payer: Self-pay | Admitting: *Deleted

## 2020-07-03 NOTE — Progress Notes (Signed)
Oncology Nurse Navigator Documentation  Oncology Nurse Navigator Flowsheets 07/03/2020  Abnormal Finding Date -  Confirmed Diagnosis Date -  Diagnosis Status -  Planned Course of Treatment -  Phase of Treatment -  Chemotherapy Pending- Reason: -  Chemotherapy Actual Start Date: -  Expected Surgery Date -  Navigator Follow Up Date: 09/07/2020  Navigator Follow Up Reason: Follow-up Appointment  Navigator Location [facility]  Referral Date to RadOnc/MedOnc -  Navigator Encounter Type Appt/Treatment Plan Review;Diagnostic Results  Telephone -  Treatment Initiated Date -  Patient Visit Type -  Treatment Phase -  Barriers/Navigation Needs -  Education -  Interventions -  Acuity -  Referrals -  Coordination of Care -  Education Method -  Time Spent with Patient 15

## 2020-09-07 ENCOUNTER — Inpatient Hospital Stay: Payer: BC Managed Care – PPO | Attending: Hematology & Oncology

## 2020-09-07 ENCOUNTER — Inpatient Hospital Stay: Payer: BC Managed Care – PPO | Admitting: Hematology & Oncology

## 2020-09-08 ENCOUNTER — Encounter: Payer: Self-pay | Admitting: *Deleted

## 2020-09-08 NOTE — Progress Notes (Signed)
Patient was a no-show to yesterday's appointment. Called mobile number first and that number seemed to be out of service. Called home number and left message on voicemail. Will continue to follow to try and get patient rescheduled.   Oncology Nurse Navigator Documentation  Oncology Nurse Navigator Flowsheets 09/08/2020  Abnormal Finding Date -  Confirmed Diagnosis Date -  Diagnosis Status -  Planned Course of Treatment -  Phase of Treatment -  Chemotherapy Pending- Reason: -  Chemotherapy Actual Start Date: -  Expected Surgery Date -  Navigator Follow Up Date: -  Navigator Follow Up Reason: -  Navigator Location [facility]  Referral Date to RadOnc/MedOnc -  Navigator Encounter Type Appt/Treatment Plan Review  Telephone -  Treatment Initiated Date -  Patient Visit Type MedOnc  Treatment Phase Post-Tx Follow-up  Barriers/Navigation Needs Coordination of Care  Education -  Interventions Coordination of Care  Acuity Level 2-Minimal Needs (1-2 Barriers Identified)  Referrals -  Coordination of Care Appts  Education Method -  Support Groups/Services Friends and Family  Time Spent with Patient 15

## 2020-09-12 ENCOUNTER — Encounter: Payer: Self-pay | Admitting: *Deleted

## 2020-09-12 NOTE — Progress Notes (Signed)
Spoke with patient who states he was unable to come to his appointment due to work, and his phone was cut off last week. He was transferred to scheduling to reschedule his appointment.  Oncology Nurse Navigator Documentation  Oncology Nurse Navigator Flowsheets 09/12/2020  Abnormal Finding Date -  Confirmed Diagnosis Date -  Diagnosis Status -  Planned Course of Treatment -  Phase of Treatment -  Chemotherapy Pending- Reason: -  Chemotherapy Actual Start Date: -  Expected Surgery Date -  Navigator Follow Up Date: 09/25/2020  Navigator Follow Up Reason: Follow-up Appointment  Navigator Location [facility]  Referral Date to RadOnc/MedOnc -  Navigator Encounter Type Appt/Treatment Plan Review;Telephone  Telephone Outgoing Call;Appt Confirmation/Clarification  Treatment Initiated Date -  Patient Visit Type MedOnc  Treatment Phase Post-Tx Follow-up  Barriers/Navigation Needs Coordination of Care  Education -  Interventions Coordination of Care;Psycho-Social Support  Acuity Level 2-Minimal Needs (1-2 Barriers Identified)  Referrals -  Coordination of Care Appts  Education Method -  Support Groups/Services Friends and Family  Time Spent with Patient 15

## 2020-09-25 ENCOUNTER — Inpatient Hospital Stay: Payer: BC Managed Care – PPO | Attending: Hematology & Oncology

## 2020-09-25 ENCOUNTER — Encounter: Payer: Self-pay | Admitting: Hematology & Oncology

## 2020-09-25 ENCOUNTER — Inpatient Hospital Stay (HOSPITAL_BASED_OUTPATIENT_CLINIC_OR_DEPARTMENT_OTHER): Payer: BC Managed Care – PPO | Admitting: Hematology & Oncology

## 2020-09-25 ENCOUNTER — Telehealth: Payer: Self-pay | Admitting: Hematology & Oncology

## 2020-09-25 ENCOUNTER — Other Ambulatory Visit: Payer: Self-pay

## 2020-09-25 VITALS — BP 161/94 | HR 59 | Temp 98.4°F | Resp 16 | Wt 192.0 lb

## 2020-09-25 DIAGNOSIS — C186 Malignant neoplasm of descending colon: Secondary | ICD-10-CM | POA: Diagnosis not present

## 2020-09-25 DIAGNOSIS — Z85038 Personal history of other malignant neoplasm of large intestine: Secondary | ICD-10-CM | POA: Diagnosis not present

## 2020-09-25 LAB — CBC WITH DIFFERENTIAL (CANCER CENTER ONLY)
Abs Immature Granulocytes: 0.02 10*3/uL (ref 0.00–0.07)
Basophils Absolute: 0.1 10*3/uL (ref 0.0–0.1)
Basophils Relative: 1 %
Eosinophils Absolute: 0.3 10*3/uL (ref 0.0–0.5)
Eosinophils Relative: 4 %
HCT: 46 % (ref 39.0–52.0)
Hemoglobin: 15.4 g/dL (ref 13.0–17.0)
Immature Granulocytes: 0 %
Lymphocytes Relative: 34 %
Lymphs Abs: 2.1 10*3/uL (ref 0.7–4.0)
MCH: 30.5 pg (ref 26.0–34.0)
MCHC: 33.5 g/dL (ref 30.0–36.0)
MCV: 91.1 fL (ref 80.0–100.0)
Monocytes Absolute: 0.6 10*3/uL (ref 0.1–1.0)
Monocytes Relative: 9 %
Neutro Abs: 3.2 10*3/uL (ref 1.7–7.7)
Neutrophils Relative %: 52 %
Platelet Count: 223 10*3/uL (ref 150–400)
RBC: 5.05 MIL/uL (ref 4.22–5.81)
RDW: 14.7 % (ref 11.5–15.5)
WBC Count: 6.3 10*3/uL (ref 4.0–10.5)
nRBC: 0 % (ref 0.0–0.2)

## 2020-09-25 LAB — CMP (CANCER CENTER ONLY)
ALT: 28 U/L (ref 0–44)
AST: 26 U/L (ref 15–41)
Albumin: 4.2 g/dL (ref 3.5–5.0)
Alkaline Phosphatase: 70 U/L (ref 38–126)
Anion gap: 8 (ref 5–15)
BUN: 11 mg/dL (ref 6–20)
CO2: 27 mmol/L (ref 22–32)
Calcium: 9.5 mg/dL (ref 8.9–10.3)
Chloride: 107 mmol/L (ref 98–111)
Creatinine: 1.06 mg/dL (ref 0.61–1.24)
GFR, Estimated: >60 mL/min (ref >60)
Glucose, Bld: 104 mg/dL — ABNORMAL HIGH (ref 70–99)
Potassium: 3.7 mmol/L (ref 3.5–5.1)
Sodium: 142 mmol/L (ref 135–145)
Total Bilirubin: 0.6 mg/dL (ref 0.3–1.2)
Total Protein: 7.2 g/dL (ref 6.5–8.1)

## 2020-09-25 LAB — CEA (IN HOUSE-CHCC): CEA (CHCC-In House): 3.91 ng/mL (ref 0.00–5.00)

## 2020-09-25 NOTE — Telephone Encounter (Signed)
Appointments scheduled and patient will pick up contrast from Imaging Department for TC scan to be completed when he returns in Feb 2022. Per 09/25/20 los

## 2020-09-25 NOTE — Progress Notes (Signed)
Hematology and Oncology Follow Up Visit  B[facility] [id number] 1961/08/03 59 y.o. 09/25/2020   Principle Diagnosis:   Stage IIIB ([id number]) adenocarcinoma of the splenic flexure -- MMR proficeint  Current Therapy:    Observation     Interim History:  [name] is back for long awaited follow-up.  Everything looks great.  We last saw him back in August.  Yet she had a CT scan done on 06/26/2020.  The CT scan did not show any evidence of recurrent colon cancer.  Unfortunately, his girlfriend just had surgery to remove some tumors in her liver.  I think she also has metastatic colon cancer.  He has had no problems working.  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  His last CEA level back in July was 4.48.  He has had a good appetite.  He has had no nausea or vomiting.  He has had no change in bowel or bladder habits.  There is been no problems with bleeding.  He has had no cough or shortness of breath.  There is been no leg swelling.  Overall, his performance status is ECOG 0.   Medications:  Current Outpatient Medications:  .  HYDROcodone-acetaminophen (NORCO/VICODIN) 5-325 MG tablet, Take 1-2 tablets by mouth every 6 (six) hours as needed for moderate pain or severe pain., Disp: 15 tablet, Rfl: 0  Allergies: No Known Allergies  Past Medical History, Surgical history, Social history, and Family History were reviewed and updated.  Review of Systems: Review of Systems  Constitutional: Negative.   HENT:  Negative.   Eyes: Negative.   Respiratory: Negative.   Cardiovascular: Negative.   Gastrointestinal: Negative.   Endocrine: Negative.   Genitourinary: Negative.    Musculoskeletal: Negative.   Skin: Negative.   Neurological: Negative.   Hematological: Negative.   Psychiatric/Behavioral: Negative.     Physical Exam:  weight is 192 lb (87.1 kg). His oral temperature is 98.4 F (36.9  C). His blood pressure is 161/94 (abnormal) and his pulse is 59 (abnormal). His respiration is 16 and oxygen saturation is 100%.   Wt Readings from Last 3 Encounters:  09/25/20 192 lb (87.1 kg)  06/13/20 194 lb (88 kg)  08/19/19 175 lb 1.9 oz (79.4 kg)    Physical Exam Vitals reviewed.  HENT:     Head: Normocephalic and atraumatic.  Eyes:     Pupils: Pupils are equal, round, and reactive to light.  Cardiovascular:     Rate and Rhythm: Normal rate and regular rhythm.     Heart sounds: Normal heart sounds.  Pulmonary:     Effort: Pulmonary effort is normal.     Breath sounds: Normal breath sounds.  Abdominal:     General: Bowel sounds are normal.     Palpations: Abdomen is soft.  Musculoskeletal:        General: No tenderness or deformity. Normal range of motion.     Cervical back: Normal range of motion.  Lymphadenopathy:     Cervical: No cervical adenopathy.  Skin:    General: Skin is warm and dry.     Findings: No erythema or rash.  Neurological:     Mental Status: He is alert and oriented to person, place, and time.  Psychiatric:        Behavior: Behavior normal.        Thought Content: Thought content normal.        Judgment: Judgment  normal.    Lab Results  Component Value Date   WBC 6.3 09/25/2020   HGB 15.4 09/25/2020   HCT 46.0 09/25/2020   MCV 91.1 09/25/2020   PLT 223 09/25/2020     Chemistry      Component Value Date/Time   NA 142 09/25/2020 0835   K 3.7 09/25/2020 0835   CL 107 09/25/2020 0835   CO2 27 09/25/2020 0835   BUN 11 09/25/2020 0835   CREATININE 1.06 09/25/2020 0835      Component Value Date/Time   CALCIUM 9.5 09/25/2020 0835   ALKPHOS 70 09/25/2020 0835   AST 26 09/25/2020 0835   ALT 28 09/25/2020 0835   BILITOT 0.6 09/25/2020 0835      Impression and Plan: [name] is a very nice 58 year old African-American male.  He has a history of stage IIIb colon cancer.  This is of the splenic flexure.  He underwent resection.  He had  essentially no adjuvant chemotherapy.  He was supposed to have adjuvant chemotherapy but somehow he really did not wish to undergo this.  I think that he still has a risk of recurrence.  I had to set him up with some more scans when we see him back.  I will plan to see him back in about 4 months.  I think we get scans the same day that we see him.  I would think that the CEA level also will be helpful for [place] to determine if he has any recurrence.  I did thank him for his service to our country.  With Veterans Day coming up, I want to make sure that we acknowledged his sacrifice for our country.     [name], MD 11/1/20219:42 AM

## 2020-09-27 ENCOUNTER — Encounter: Payer: Self-pay | Admitting: *Deleted

## 2020-09-27 NOTE — Progress Notes (Signed)
Oncology Nurse Navigator Documentation  Oncology Nurse Navigator Flowsheets 09/27/2020  Abnormal Finding Date -  Confirmed Diagnosis Date -  Diagnosis Status -  Planned Course of Treatment -  Phase of Treatment -  Chemotherapy Pending- Reason: -  Chemotherapy Actual Start Date: -  Expected Surgery Date -  Navigator Follow Up Date: 12/26/2020  Navigator Follow Up Reason: Follow-up Appointment  Navigator Location [facility]  Referral Date to RadOnc/MedOnc -  Navigator Encounter Type Appt/Treatment Plan Review  Telephone -  Treatment Initiated Date -  Patient Visit Type MedOnc  Treatment Phase Post-Tx Follow-up  Barriers/Navigation Needs Coordination of Care  Education -  Interventions None Required  Acuity Level 2-Minimal Needs (1-2 Barriers Identified)  Referrals -  Coordination of Care -  Education Method -  Support Groups/Services Friends and Family  Time Spent with Patient 15

## 2020-12-26 ENCOUNTER — Inpatient Hospital Stay: Payer: BC Managed Care – PPO | Attending: Hematology & Oncology

## 2020-12-26 ENCOUNTER — Other Ambulatory Visit (HOSPITAL_BASED_OUTPATIENT_CLINIC_OR_DEPARTMENT_OTHER): Payer: BC Managed Care – PPO

## 2020-12-26 ENCOUNTER — Inpatient Hospital Stay: Payer: BC Managed Care – PPO | Admitting: Hematology & Oncology

## 2020-12-26 NOTE — Progress Notes (Incomplete)
Hematology and Oncology Follow Up Visit  Billy [facility] [id number] 13-Jul-1961 60 y.o. 12/26/2020   Principle Diagnosis:   Stage IIIB ([id number]) adenocarcinoma of the splenic flexure -- MMR proficeint  Current Therapy:    Observation     Interim History:  [facility] is back for long awaited follow-up.  Everything looks great.  We last saw him back in August.  Yet she had a CT scan done on 06/26/2020.  The CT scan did not show any evidence of recurrent colon cancer.  Unfortunately, his girlfriend just had surgery to remove some tumors in her liver.  I think she also has metastatic colon cancer.  He has had no problems working.  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  His last CEA level back in July was 4.48.  He has had a good appetite.  He has had no nausea or vomiting.  He has had no change in bowel or bladder habits.  There is been no problems with bleeding.  He has had no cough or shortness of breath.  There is been no leg swelling.  Overall, his performance status is ECOG 0.   Medications:  Current Outpatient Medications:  .  HYDROcodone-acetaminophen (NORCO/VICODIN) 5-325 MG tablet, Take 1-2 tablets by mouth every 6 (six) hours as needed for moderate pain or severe pain., Disp: 15 tablet, Rfl: 0  Allergies: No Known Allergies  Past Medical History, Surgical history, Social history, and Family History were reviewed and updated.  Review of Systems: Review of Systems  Constitutional: Negative.   HENT:  Negative.   Eyes: Negative.   Respiratory: Negative.   Cardiovascular: Negative.   Gastrointestinal: Negative.   Endocrine: Negative.   Genitourinary: Negative.    Musculoskeletal: Negative.   Skin: Negative.   Neurological: Negative.   Hematological: Negative.   Psychiatric/Behavioral: Negative.     Physical Exam:  vitals were not taken for this visit.   Wt Readings from Last 3  Encounters:  09/25/20 192 lb (87.1 kg)  06/13/20 194 lb (88 kg)  08/19/19 175 lb 1.9 oz (79.4 kg)    Physical Exam Vitals reviewed.  HENT:     Head: Normocephalic and atraumatic.  Eyes:     Pupils: Pupils are equal, round, and reactive to light.  Cardiovascular:     Rate and Rhythm: Normal rate and regular rhythm.     Heart sounds: Normal heart sounds.  Pulmonary:     Effort: Pulmonary effort is normal.     Breath sounds: Normal breath sounds.  Abdominal:     General: Bowel sounds are normal.     Palpations: Abdomen is soft.  Musculoskeletal:        General: No tenderness or deformity. Normal range of motion.     Cervical back: Normal range of motion.  Lymphadenopathy:     Cervical: No cervical adenopathy.  Skin:    General: Skin is warm and dry.     Findings: No erythema or rash.  Neurological:     Mental Status: He is alert and oriented to person, place, and time.  Psychiatric:        Behavior: Behavior normal.        Thought Content: Thought content normal.        Judgment: Judgment normal.    Lab Results  Component Value Date   WBC 6.3 09/25/2020   HGB 15.4 09/25/2020   HCT 46.0 09/25/2020   MCV  91.1 09/25/2020   PLT 223 09/25/2020     Chemistry      Component Value Date/Time   NA 142 09/25/2020 0835   K 3.7 09/25/2020 0835   CL 107 09/25/2020 0835   CO2 27 09/25/2020 0835   BUN 11 09/25/2020 0835   CREATININE 1.06 09/25/2020 0835      Component Value Date/Time   CALCIUM 9.5 09/25/2020 0835   ALKPHOS 70 09/25/2020 0835   AST 26 09/25/2020 0835   ALT 28 09/25/2020 0835   BILITOT 0.6 09/25/2020 0835      Impression and Plan: Billy Yates is a very nice 60 year old African-American male.  He has a history of stage IIIb colon cancer.  This is of the splenic flexure.  He underwent resection.  He had essentially no adjuvant chemotherapy.  He was supposed to have adjuvant chemotherapy but somehow he really did not wish to undergo this.  I think that he  still has a risk of recurrence.  I had to set him up with some more scans when we see him back.  I will plan to see him back in about 4 months.  I think we get scans the same day that we see him.  I would think that the CEA level also will be helpful for [place] to determine if he has any recurrence.  I did thank him for his service to our country.  With Veterans Day coming up, I want to make sure that we acknowledged his sacrifice for our country.     [name], MD 2/1/20228:02 AM

## 2020-12-27 ENCOUNTER — Encounter: Payer: Self-pay | Admitting: *Deleted

## 2020-12-27 NOTE — Progress Notes (Signed)
Patient was a no-show yesterday for his scans and follow up appointment. Attempted to call patient. Message left on voicemail. Due to previous history of being unable to reach patient via telephone, we will send a letter to patient home to request follow up.  Oncology Nurse Navigator Documentation  Oncology Nurse Navigator Flowsheets 12/27/2020  Abnormal Finding Date -  Confirmed Diagnosis Date -  Diagnosis Status -  Planned Course of Treatment -  Phase of Treatment -  Chemotherapy Pending- Reason: -  Chemotherapy Actual Start Date: -  Expected Surgery Date -  Navigator Follow Up Date: -  Navigator Follow Up Reason: -  Navigator Location [facility]  Referral Date to RadOnc/MedOnc -  Navigator Encounter Type Appt/Treatment Plan Review;Letter/Fax/Email  Telephone Outgoing Call;Appt Confirmation/Clarification  Treatment Initiated Date -  Patient Visit Type MedOnc  Treatment Phase Post-Tx Follow-up  Barriers/Navigation Needs Coordination of Care  Education Other  Interventions Coordination of Care  Acuity Level 2-Minimal Needs (1-2 Barriers Identified)  Referrals -  Coordination of Care Appts  Education Method Verbal  Support Groups/Services Friends and Family  Time Spent with Patient 30

## 2021-06-04 ENCOUNTER — Telehealth: Payer: Self-pay | Admitting: *Deleted

## 2021-06-04 NOTE — Telephone Encounter (Signed)
Patient called stating that he has had rectal bleeding since Saturday and would like to get an appt to see Dr Marin Olp.  Patient doesn't have a [employer].  Dr Marin Olp said he will see him in next few weeks.  Scheduling message sent.

## 2021-06-05 ENCOUNTER — Telehealth: Payer: Self-pay

## 2021-06-05 ENCOUNTER — Encounter: Payer: Self-pay | Admitting: Hematology

## 2021-06-12 ENCOUNTER — Encounter: Payer: Self-pay | Admitting: Hematology & Oncology

## 2021-06-12 ENCOUNTER — Encounter: Payer: Self-pay | Admitting: Hematology

## 2021-06-12 ENCOUNTER — Other Ambulatory Visit: Payer: Self-pay

## 2021-06-12 ENCOUNTER — Inpatient Hospital Stay (HOSPITAL_BASED_OUTPATIENT_CLINIC_OR_DEPARTMENT_OTHER): Payer: 59 | Admitting: Hematology & Oncology

## 2021-06-12 ENCOUNTER — Telehealth: Payer: Self-pay

## 2021-06-12 ENCOUNTER — Inpatient Hospital Stay: Payer: 59 | Attending: Hematology & Oncology

## 2021-06-12 VITALS — BP 152/84 | HR 63 | Temp 98.4°F | Resp 17 | Wt 198.0 lb

## 2021-06-12 DIAGNOSIS — C184 Malignant neoplasm of transverse colon: Secondary | ICD-10-CM

## 2021-06-12 DIAGNOSIS — Z85038 Personal history of other malignant neoplasm of large intestine: Secondary | ICD-10-CM | POA: Insufficient documentation

## 2021-06-12 DIAGNOSIS — C186 Malignant neoplasm of descending colon: Secondary | ICD-10-CM

## 2021-06-12 LAB — CMP (CANCER CENTER ONLY)
ALT: 34 U/L (ref 0–44)
AST: 34 U/L (ref 15–41)
Albumin: 4.1 g/dL (ref 3.5–5.0)
Alkaline Phosphatase: 81 U/L (ref 38–126)
Anion gap: 7 (ref 5–15)
BUN: 13 mg/dL (ref 6–20)
CO2: 25 mmol/L (ref 22–32)
Calcium: 8.6 mg/dL — ABNORMAL LOW (ref 8.9–10.3)
Chloride: 108 mmol/L (ref 98–111)
Creatinine: 1.2 mg/dL (ref 0.61–1.24)
GFR, Estimated: >60 mL/min (ref >60)
Glucose, Bld: 111 mg/dL — ABNORMAL HIGH (ref 70–99)
Potassium: 3.7 mmol/L (ref 3.5–5.1)
Sodium: 140 mmol/L (ref 135–145)
Total Bilirubin: 0.6 mg/dL (ref 0.3–1.2)
Total Protein: 7.5 g/dL (ref 6.5–8.1)

## 2021-06-12 LAB — CBC WITH DIFFERENTIAL (CANCER CENTER ONLY)
Abs Immature Granulocytes: 0.02 10*3/uL (ref 0.00–0.07)
Basophils Absolute: 0.1 10*3/uL (ref 0.0–0.1)
Basophils Relative: 1 %
Eosinophils Absolute: 0.4 10*3/uL (ref 0.0–0.5)
Eosinophils Relative: 6 %
HCT: 43.4 % (ref 39.0–52.0)
Hemoglobin: 14.7 g/dL (ref 13.0–17.0)
Immature Granulocytes: 0 %
Lymphocytes Relative: 33 %
Lymphs Abs: 1.9 10*3/uL (ref 0.7–4.0)
MCH: 31.5 pg (ref 26.0–34.0)
MCHC: 33.9 g/dL (ref 30.0–36.0)
MCV: 92.9 fL (ref 80.0–100.0)
Monocytes Absolute: 0.7 10*3/uL (ref 0.1–1.0)
Monocytes Relative: 11 %
Neutro Abs: 2.8 10*3/uL (ref 1.7–7.7)
Neutrophils Relative %: 49 %
Platelet Count: 226 10*3/uL (ref 150–400)
RBC: 4.67 MIL/uL (ref 4.22–5.81)
RDW: 15 % (ref 11.5–15.5)
WBC Count: 5.8 10*3/uL (ref 4.0–10.5)
nRBC: 0 % (ref 0.0–0.2)

## 2021-06-12 LAB — CEA (IN HOUSE-CHCC): CEA (CHCC-In House): 5.61 ng/mL — ABNORMAL HIGH (ref 0.00–5.00)

## 2021-06-12 LAB — LACTATE DEHYDROGENASE: LDH: 144 U/L (ref 98–192)

## 2021-06-12 NOTE — Telephone Encounter (Signed)
Appts made per 06/12/21 los, pt req to view chart and aware that he will get a call with sche for ct scan  Taeja Debellis

## 2021-06-12 NOTE — Progress Notes (Signed)
Hematology and Oncology Follow Up Visit  Billy Yates [id number] [birth year] 60 y.o. 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  We saw him 6 months ago.  He is doing pretty well.  Has a new job right now.  He enjoys this.  His girlfriend is done with her metastatic colon cancer.  She is taking a break from chemotherapy right now.  She is being treated at [facility].  Had episode of bright red blood per rectum recently.  This does seem to be an isolated incident.  His last CEA back in November of last year was 3.9.  He has had no problems with abdominal pain.  He has had no urinary issues.  He has had no cough or shortness of breath.  There has been no problems with COVID.  He has had no leg swelling.  Overall, his performance status is ECOG 0.    Medications:  Current Outpatient Medications:    HYDROcodone-acetaminophen (NORCO/VICODIN) 5-325 MG tablet, Take 1-2 tablets by mouth every 6 (six) hours as needed for moderate pain or severe pain. (Patient not taking: Reported on 06/12/2021), Disp: 15 tablet, Rfl: 0  Allergies: No Known Allergies  Past Medical History, Surgical history, Social history, and Family History were reviewed and updated.  Review of Systems: Review of Systems  Constitutional: Negative.   HENT:  Negative.    Eyes: Negative.   Respiratory: Negative.    Cardiovascular: Negative.   Gastrointestinal: Negative.   Endocrine: Negative.   Genitourinary: Negative.    Musculoskeletal: Negative.   Skin: Negative.   Neurological: Negative.   Hematological: Negative.   Psychiatric/Behavioral: Negative.     Physical Exam:  weight is 198 lb (89.8 kg). His oral temperature is 98.4 F (36.9 C). His blood pressure is 152/84 (abnormal) and his pulse is 63. His respiration is 17 and oxygen saturation is 98%.    Wt Readings from Last 3 Encounters:  06/12/21 198 lb (89.8 kg)  09/25/20 192 lb (87.1 kg)  06/13/20 194 lb (88 kg)    Physical Exam Vitals reviewed.  HENT:     Head: Normocephalic and atraumatic.  Eyes:     Pupils: Pupils are equal, round, and reactive to light.  Cardiovascular:     Rate and Rhythm: Normal rate and regular rhythm.     Heart sounds: Normal heart sounds.  Pulmonary:     Effort: Pulmonary effort is normal.     Breath sounds: Normal breath sounds.  Abdominal:     General: Bowel sounds are normal.     Palpations: Abdomen is soft.  Musculoskeletal:        General: No tenderness or deformity. Normal range of motion.     Cervical back: Normal range of motion.  Lymphadenopathy:     Cervical: No cervical adenopathy.  Skin:    General: Skin is warm and dry.     Findings: No erythema or rash.  Neurological:     Mental Status: He is alert and oriented to person, place, and time.  Psychiatric:        Behavior: Behavior normal.        Thought Content: Thought content normal.        Judgment: Judgment normal.   Lab Results  Component Value Date   WBC 5.8 06/12/2021   HGB 14.7 06/12/2021   HCT  43.4 06/12/2021   MCV 92.9 06/12/2021   PLT 226 06/12/2021     Chemistry      Component Value Date/Time   NA 142 09/25/2020 0835   K 3.7 09/25/2020 0835   CL 107 09/25/2020 0835   CO2 27 09/25/2020 0835   BUN 11 09/25/2020 0835   CREATININE 1.06 09/25/2020 0835      Component Value Date/Time   CALCIUM 9.5 09/25/2020 0835   ALKPHOS 70 09/25/2020 0835   AST 26 09/25/2020 0835   ALT 28 09/25/2020 0835   BILITOT 0.6 09/25/2020 0835      Impression and Plan: Billy Yates is a very nice 60 year old African-American male.  He has a history of stage IIIb colon cancer.  This is of the splenic flexure.  He underwent resection.  This was in August 2020.  He had 2 of 13 lymph nodes that were positive.  There is lymphovascular space invasion.  He had essentially no  adjuvant chemotherapy.  He was supposed to have adjuvant chemotherapy but somehow he really did not wish to undergo this.  I think that he still has a risk of recurrence.  We will set him up for a CT scan.  We will see about getting this done next week.  We will see what his CEA level is.  Final think that this rectal bleeding is a indicator of recurrent disease.  However, we have to be cautious.  I will still plan to get him back to see [place] in another 6 months.    [name], MD 7/19/20228:07 AM

## 2021-06-19 ENCOUNTER — Encounter: Payer: Self-pay | Admitting: *Deleted

## 2021-06-19 NOTE — Progress Notes (Signed)
Patient was seen for follow up after an incident of rectal bleeding. He is now scheduled for staging CTs on 06/27/21. Will follow for results.   Oncology Nurse Navigator Documentation  Oncology Nurse Navigator Flowsheets 06/19/2021  Abnormal Finding Date -  Confirmed Diagnosis Date -  Diagnosis Status -  Planned Course of Treatment -  Phase of Treatment -  Chemotherapy Pending- Reason: -  Chemotherapy Actual Start Date: -  Expected Surgery Date -  Navigator Follow Up Date: 06/27/2021  Navigator Follow Up Reason: Scan Review  Navigator Location [facility]  Referral Date to RadOnc/MedOnc -  Navigator Encounter Type Appt/Treatment Plan Review  Telephone -  Treatment Initiated Date -  Patient Visit Type MedOnc  Treatment Phase Post-Tx Follow-up  Barriers/Navigation Needs Coordination of Care  Education -  Interventions None Required  Acuity Level 2-Minimal Needs (1-2 Barriers Identified)  Referrals -  Coordination of Care -  Education Method -  Support Groups/Services Friends and Family  Time Spent with Patient 15

## 2021-06-27 ENCOUNTER — Encounter: Payer: Self-pay | Admitting: Hematology

## 2021-06-27 ENCOUNTER — Telehealth: Payer: Self-pay | Admitting: *Deleted

## 2021-06-27 ENCOUNTER — Encounter (HOSPITAL_BASED_OUTPATIENT_CLINIC_OR_DEPARTMENT_OTHER): Payer: Self-pay

## 2021-06-27 ENCOUNTER — Other Ambulatory Visit: Payer: Self-pay

## 2021-06-27 ENCOUNTER — Ambulatory Visit (HOSPITAL_BASED_OUTPATIENT_CLINIC_OR_DEPARTMENT_OTHER)
Admission: RE | Admit: 2021-06-27 | Discharge: 2021-06-27 | Disposition: A | Discharge status: Home / Self Care | Payer: No Typology Code available for payment source | Source: Ambulatory Visit | Attending: Hematology & Oncology | Admitting: Hematology & Oncology

## 2021-06-27 ENCOUNTER — Encounter: Payer: Self-pay | Admitting: *Deleted

## 2021-06-27 DIAGNOSIS — C184 Malignant neoplasm of transverse colon: Secondary | ICD-10-CM | POA: Diagnosis not present

## 2021-06-27 MED ORDER — IOHEXOL 300 MG/ML  SOLN
100.0000 mL | Freq: Once | INTRAMUSCULAR | Status: AC | PRN
  Administered 2021-06-27: 100 mL via INTRAVENOUS

## 2021-06-27 NOTE — Telephone Encounter (Signed)
Notified pt of results. No concerns at this time. 

## 2021-06-27 NOTE — Telephone Encounter (Signed)
-----   Message from [name], MD sent at 06/27/2021 11:58 AM EDT ----- Call - the CT scan shows NO active cancer!!  Billy Yates

## 2021-06-27 NOTE — Progress Notes (Signed)
Oncology Nurse Navigator Documentation  Oncology Nurse Navigator Flowsheets 06/27/2021  Abnormal Finding Date -  Confirmed Diagnosis Date -  Diagnosis Status -  Planned Course of Treatment -  Phase of Treatment -  Chemotherapy Pending- Reason: -  Chemotherapy Actual Start Date: -  Expected Surgery Date -  Navigator Follow Up Date: 12/11/2021  Navigator Follow Up Reason: Follow-up Appointment  Navigator Location [facility]  Referral Date to RadOnc/MedOnc -  Navigator Encounter Type Scan Review  Telephone -  Treatment Initiated Date -  Patient Visit Type MedOnc  Treatment Phase Post-Tx Follow-up  Barriers/Navigation Needs Coordination of Care  Education -  Interventions None Required  Acuity Level 1-No Barriers  Referrals -  Coordination of Care -  Education Method -  Support Groups/Services Friends and Family  Time Spent with Patient 15

## 2021-10-30 IMAGING — CT CT CHEST-ABD-PELV W/ CM
2 of 5 series · 13 of 36 positions shown, 15 images · IV contrast (Omnipaque)
Comparison: CT chest dated 08/09/2019. CT abdomen/pelvis dated
07/13/2019.

CLINICAL DATA: Colon cancer, status post partial colectomy

EXAM:
CT CHEST, ABDOMEN, AND PELVIS WITH CONTRAST
TECHNIQUE: Multidetector CT imaging of the chest, abdomen and pelvis was
performed following the standard protocol during bolus
administration of intravenous contrast.
CONTRAST:  100mL OMNIPAQUE IOHEXOL 300 MG/ML  SOLN

[Series 2: cap with 2 · axial · 0.94mm/px · z∈[-751,-121]mm · 10 of 154 slices shown, 12 images]
[im 14/154  mediastinal]
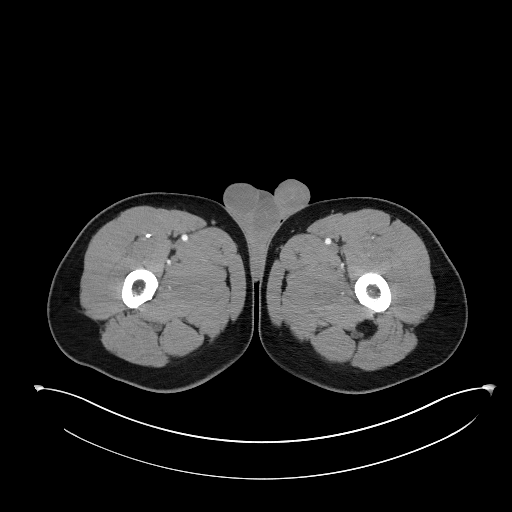
[im 14/154  bone]
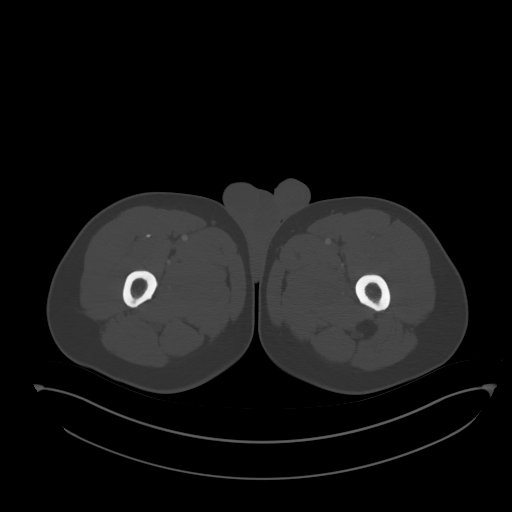
[im 28/154  mediastinal]
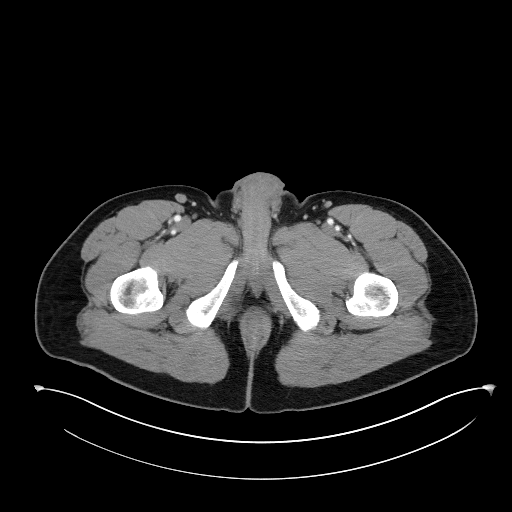
[im 42/154  mediastinal]
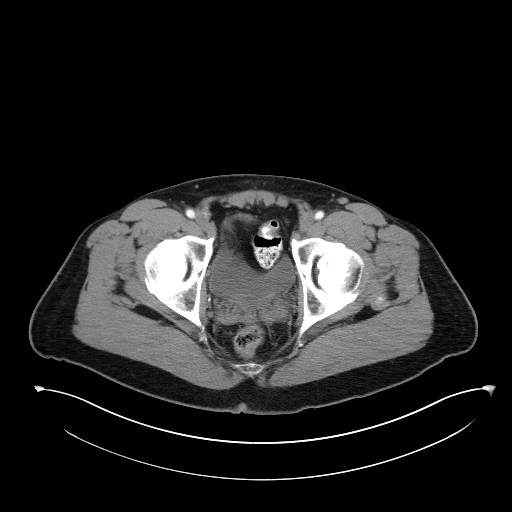
[im 56/154  mediastinal]
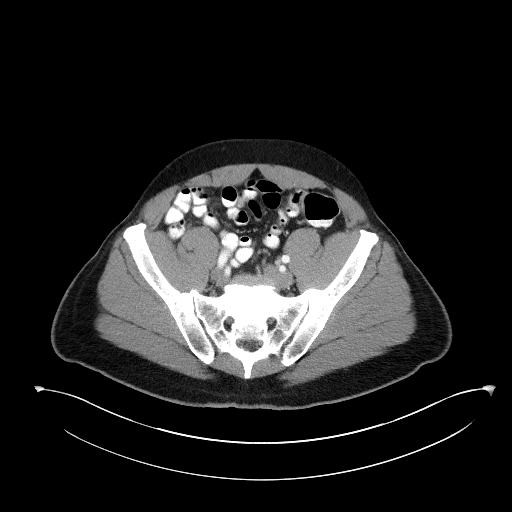
[im 70/154  mediastinal]
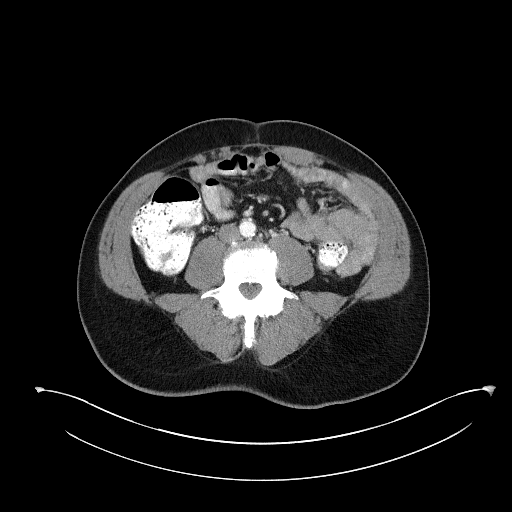
[im 84/154  mediastinal]
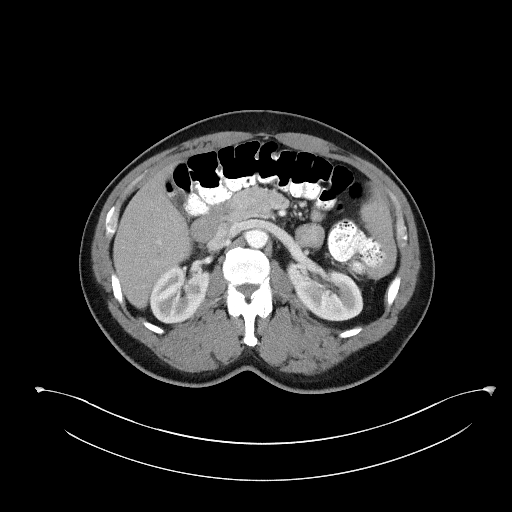
[im 98/154  mediastinal]
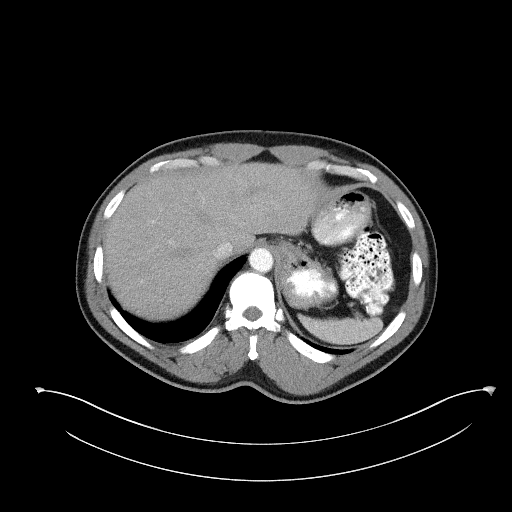
[im 112/154  mediastinal]
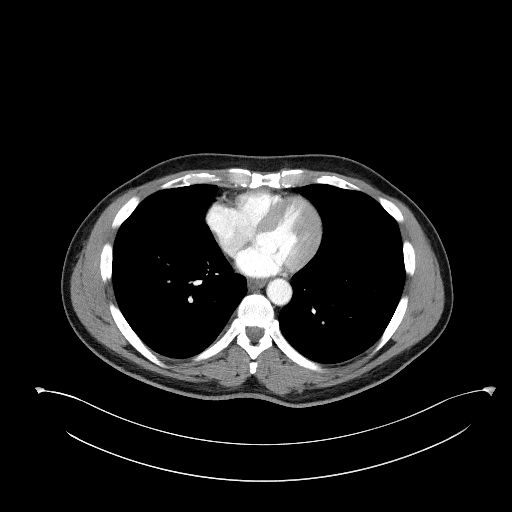
[im 126/154  mediastinal]
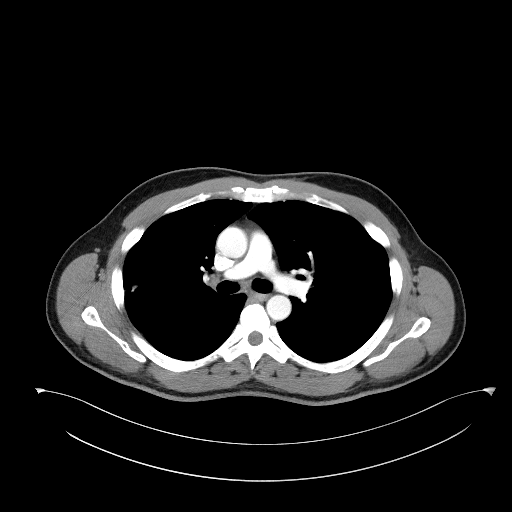
[im 126/154  bone]
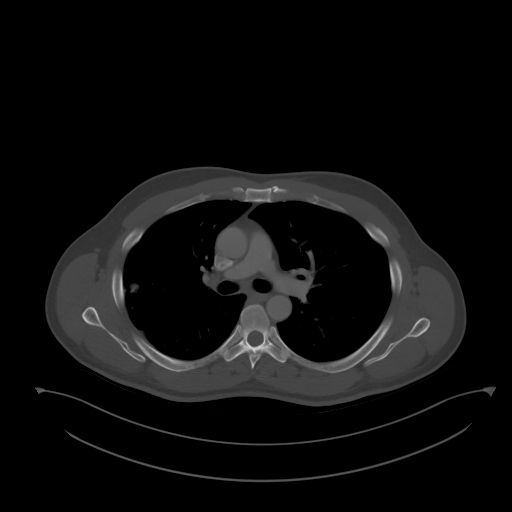
[im 140/154  mediastinal]
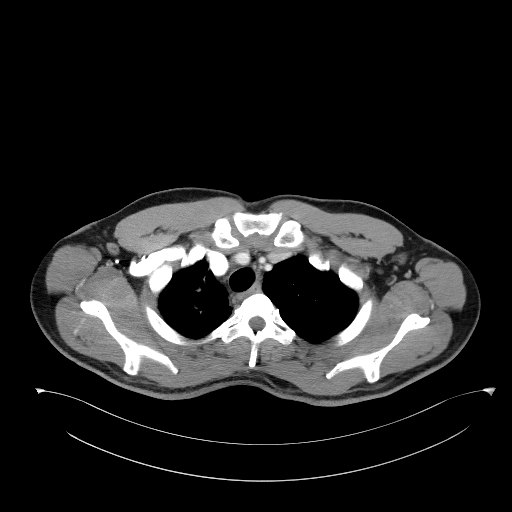

[Series 4: coronals · coronal · 0.92mm/px · 3 of 151 slices shown]
[im 31/151  mediastinal]
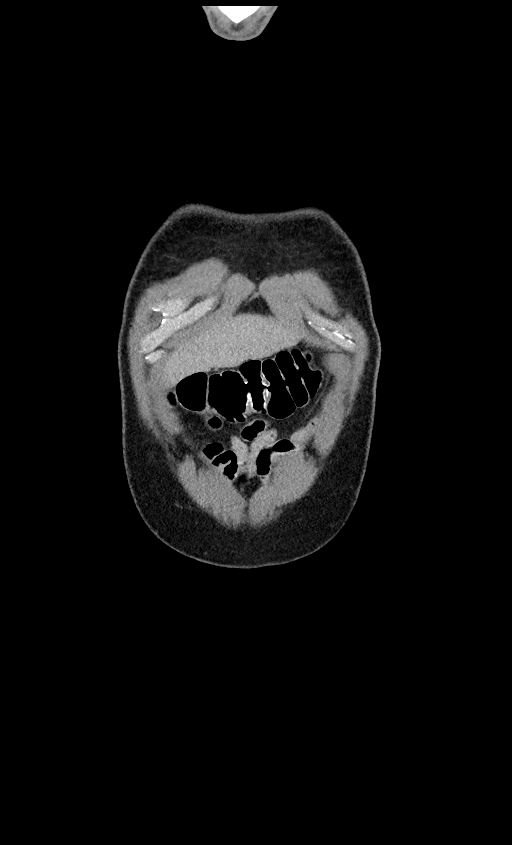
[im 61/151  mediastinal]
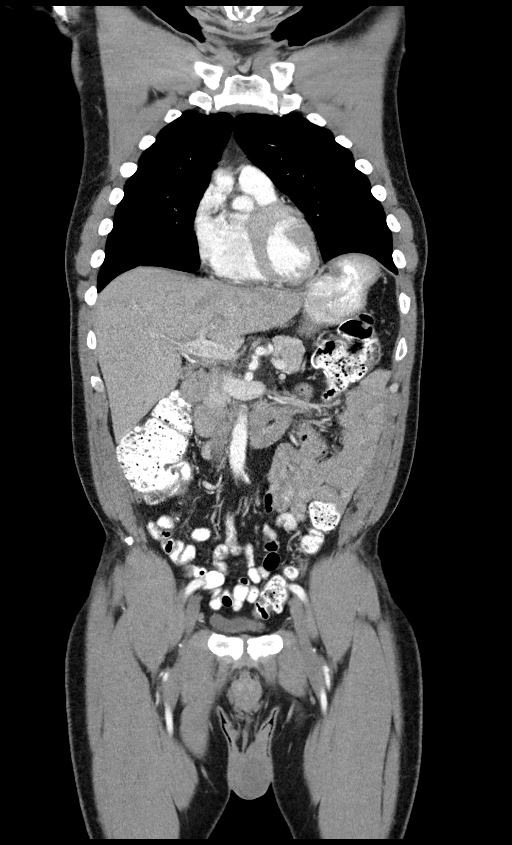
[im 91/151  mediastinal]
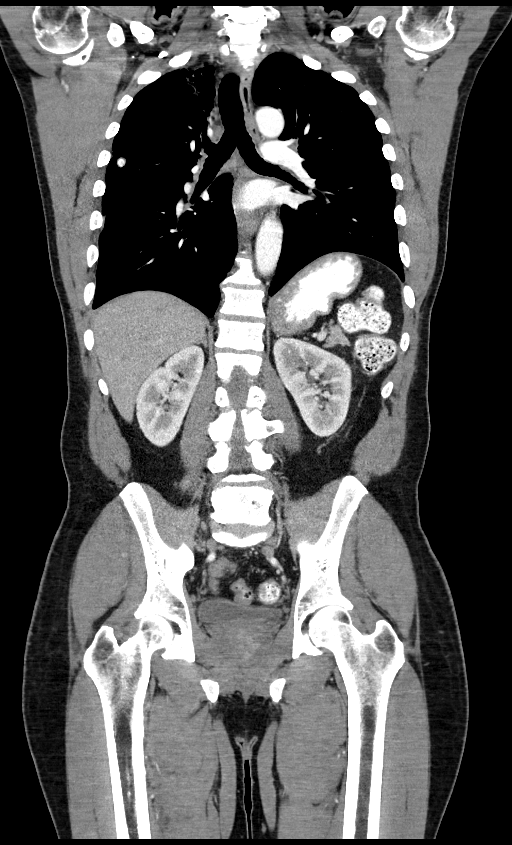

[13 of 36 positions shown; findings below may reference images not displayed]

FINDINGS: CT CHEST FINDINGS

Cardiovascular: The heart is normal in size. No pericardial
effusion.

No evidence of thoracic aortic aneurysm.

Mediastinum/Nodes: No suspicious mediastinal lymphadenopathy.

Small bilateral axillary nodes, measuring up to 12 mm short axis on
the left (series 2/image 14), likely reactive.

Lungs/Pleura: Scarring with bronchiectasis in the medial right lung
apex (series 6/image 37), benign.

11 mm partially calcified granuloma in the medial right middle lobe
(series 6/image 32), unchanged, benign. No new/suspicious pulmonary
nodules.

Mild centrilobular and paraseptal emphysematous changes, upper lung
predominant.

No focal consolidation.

Mild eventration of the left hemidiaphragm.

No pleural effusion or pneumothorax.

Musculoskeletal: Visualized thoracic spine is within normal limits.

CT ABDOMEN PELVIS FINDINGS

Hepatobiliary: Liver is within normal limits.

Gallbladder is unremarkable. No intrahepatic or extrahepatic ductal
dilatation.

Pancreas: Within normal limits.

Spleen: Within normal limits.

Adrenals/Urinary Tract: Adrenal glands are within normal limits.

Kidneys are within normal limits.  No hydronephrosis.

Bladder is within normal limits.

Stomach/Bowel: Stomach is within normal limits.

No evidence of bowel obstruction.

Normal appendix (series 2/image 95).

Status post partial left hemicolectomy with suture line in the left
mid abdomen (series 2/image 68).

No colonic wall thickening or mass is evident on CT.

Vascular/Lymphatic: No evidence of abdominal aortic aneurysm.

No suspicious abdominopelvic lymphadenopathy.

Reproductive: Prostate is unremarkable.

Other: No abdominopelvic ascites.

Musculoskeletal: Mild degenerative changes of the lumbar spine.
IMPRESSION: Status post left hemicolectomy.

No evidence of recurrent or metastatic disease.

Additional stable ancillary findings as above.

## 2021-11-22 ENCOUNTER — Telehealth: Payer: Self-pay | Admitting: *Deleted

## 2021-11-22 NOTE — Telephone Encounter (Signed)
Message received from patient to let Dr. Marin Olp know that he has had nausea, dizziness, and abd pain since Sunday, 11/18/21 and is going to an urgent care today.  Call placed back to patient and patient states that he has an appt today at 4:20PM for a local urgent care.  Dr. Marin Olp notified.

## 2021-12-11 ENCOUNTER — Ambulatory Visit: Payer: 59 | Admitting: Hematology & Oncology

## 2021-12-11 ENCOUNTER — Other Ambulatory Visit: Payer: 59

## 2021-12-18 ENCOUNTER — Inpatient Hospital Stay: Payer: No Typology Code available for payment source | Admitting: Hematology & Oncology

## 2021-12-18 ENCOUNTER — Inpatient Hospital Stay: Payer: No Typology Code available for payment source | Attending: Hematology & Oncology

## 2021-12-18 ENCOUNTER — Encounter: Payer: Self-pay | Admitting: *Deleted

## 2021-12-18 NOTE — Progress Notes (Signed)
Patient was a no-show to today's appointment. Called and left a message on voicemail requesting a call back for rescheduling.   Oncology Nurse Navigator Documentation  Oncology Nurse Navigator Flowsheets 12/18/2021  Abnormal Finding Date -  Confirmed Diagnosis Date -  Diagnosis Status -  Planned Course of Treatment -  Phase of Treatment -  Chemotherapy Pending- Reason: -  Chemotherapy Actual Start Date: -  Expected Surgery Date -  Navigator Follow Up Date: -  Navigator Follow Up Reason: -  Navigator Location [facility]  Referral Date to RadOnc/MedOnc -  Navigator Encounter Type Appt/Treatment Plan Review;Telephone  Telephone Outgoing Call  Treatment Initiated Date -  Patient Visit Type MedOnc  Treatment Phase Post-Tx Follow-up  Barriers/Navigation Needs Coordination of Care  Education Other  Interventions Education  Acuity Level 2-Minimal Needs (1-2 Barriers Identified)  Referrals -  Coordination of Care -  Education Method Verbal  Support Groups/Services Friends and Family  Time Spent with Patient 15

## 2021-12-21 ENCOUNTER — Telehealth: Payer: Self-pay | Admitting: Hematology & Oncology

## 2021-12-25 ENCOUNTER — Encounter: Payer: Self-pay | Admitting: Hematology

## 2021-12-25 NOTE — Telephone Encounter (Signed)
Called to reschedule missed appointment 1/17, patient would like to call us back to reschedule due to insurance he would like to hold off on scheduling for now

## 2022-03-26 ENCOUNTER — Other Ambulatory Visit: Payer: Self-pay | Admitting: Pharmacist

## 2022-10-31 IMAGING — CT CT CHEST-ABD-PELV W/ CM
2 of 5 series · 13 of 36 positions shown, 15 images · IV contrast (Omnipaque)
Comparison: Multiple priors including most recent CT June 26, 2020

CLINICAL DATA: Colon cancer, status post partial colectomy.
Surveillance

EXAM:
CT CHEST, ABDOMEN, AND PELVIS WITH CONTRAST
TECHNIQUE: Multidetector CT imaging of the chest, abdomen and pelvis was
performed following the standard protocol during bolus
administration of intravenous contrast.
CONTRAST:  100mL OMNIPAQUE IOHEXOL 300 MG/ML  SOLN

[Series 2: cap with 2 · axial · 0.95mm/px · z∈[-637,-62]mm · 10 of 141 slices shown, 12 images]
[im 13/141  mediastinal]
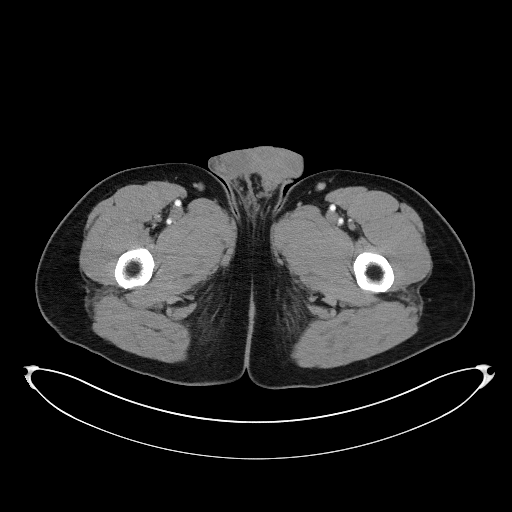
[im 13/141  bone]
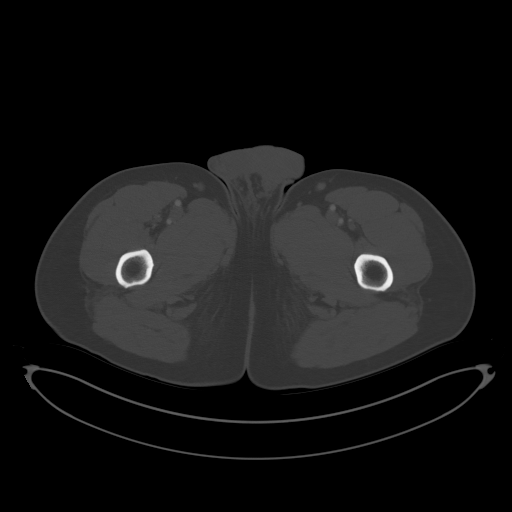
[im 26/141  mediastinal]
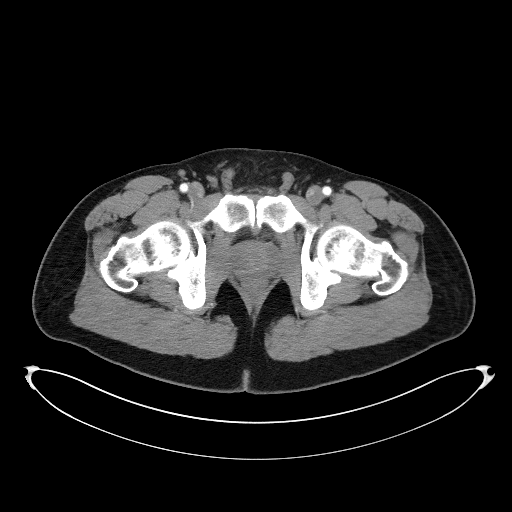
[im 39/141  mediastinal]
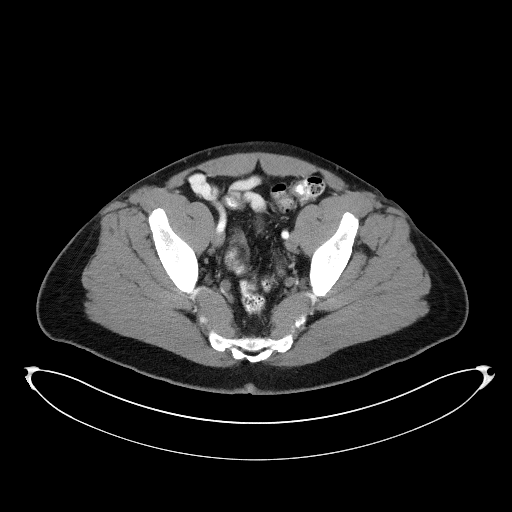
[im 51/141  mediastinal]
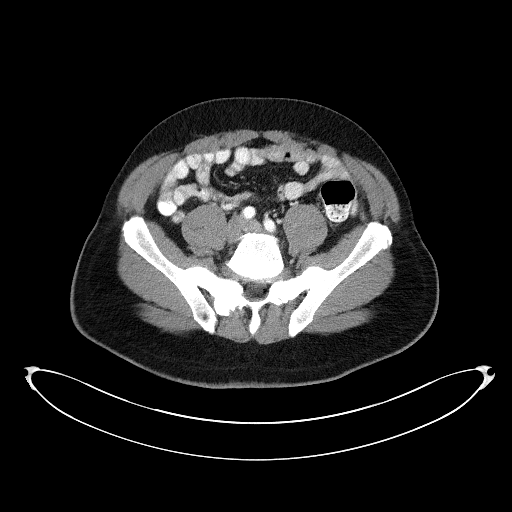
[im 64/141  mediastinal]
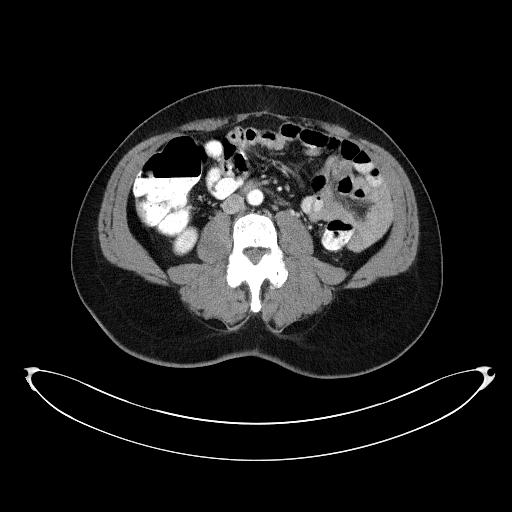
[im 77/141  mediastinal]
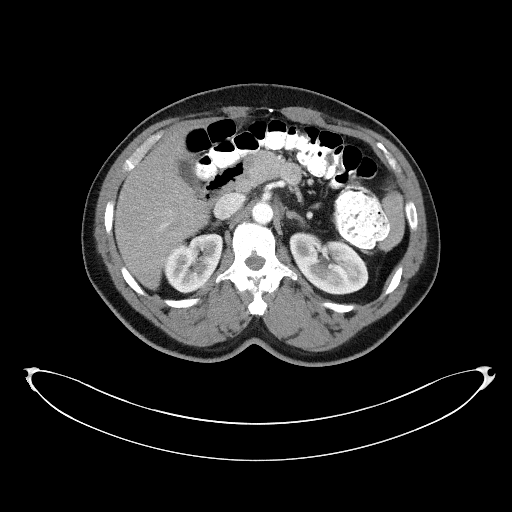
[im 90/141  mediastinal]
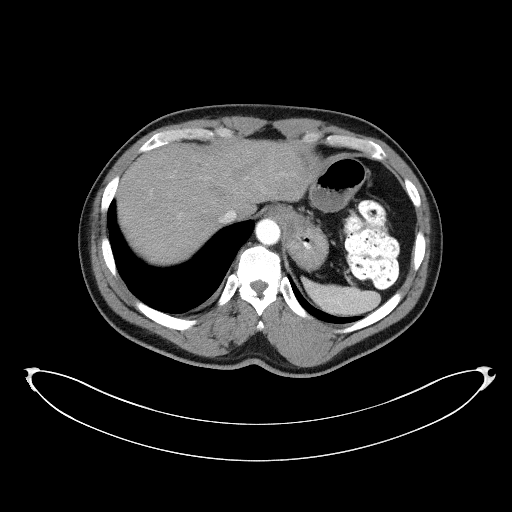
[im 102/141  mediastinal]
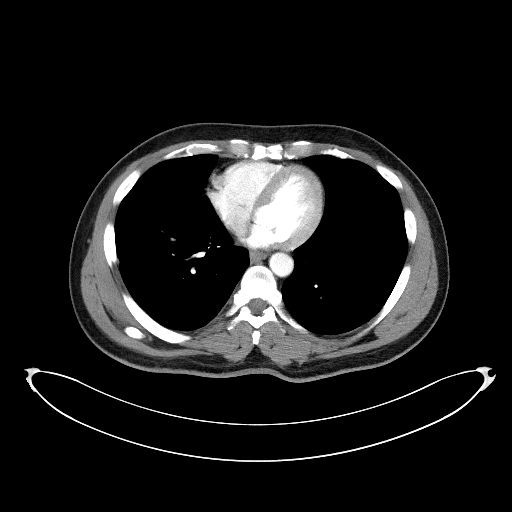
[im 115/141  mediastinal]
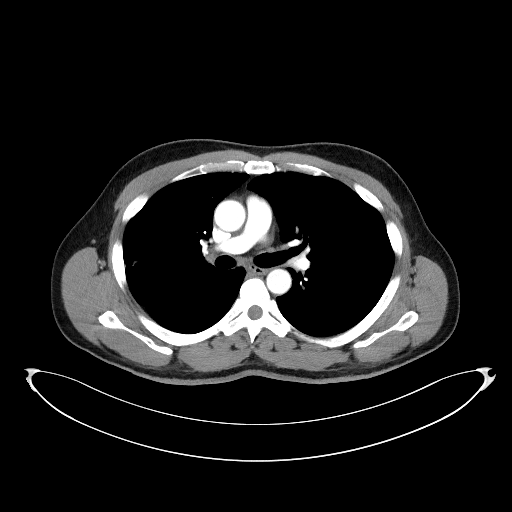
[im 115/141  bone]
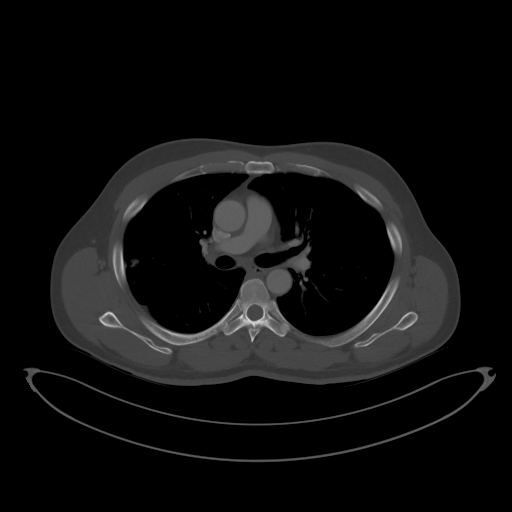
[im 128/141  mediastinal]
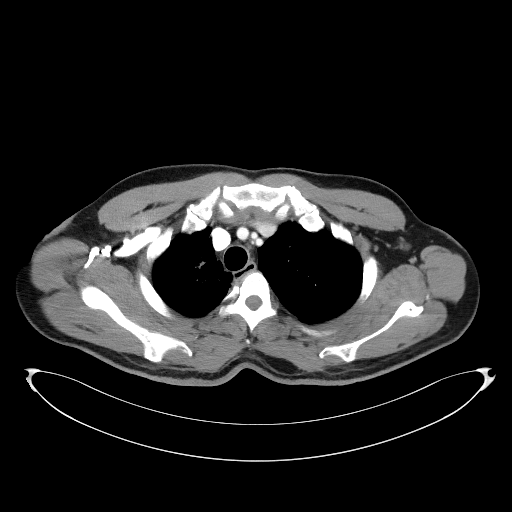

[Series 5: coronals · coronal · 0.80mm/px · 3 of 156 slices shown]
[im 32/156  mediastinal]
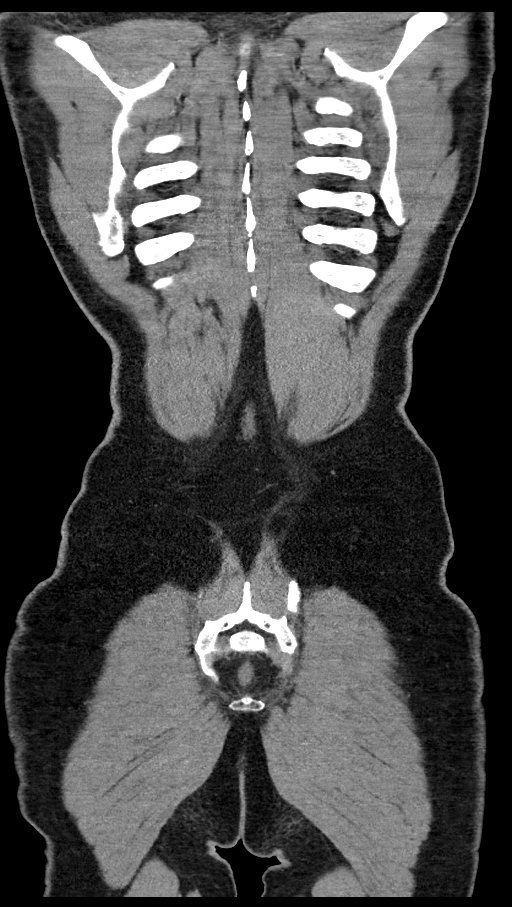
[im 63/156  mediastinal]
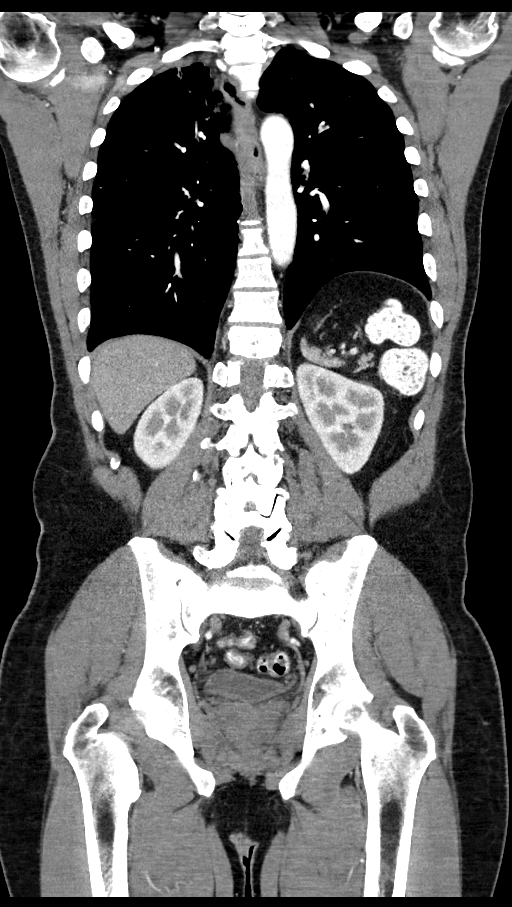
[im 94/156  mediastinal]
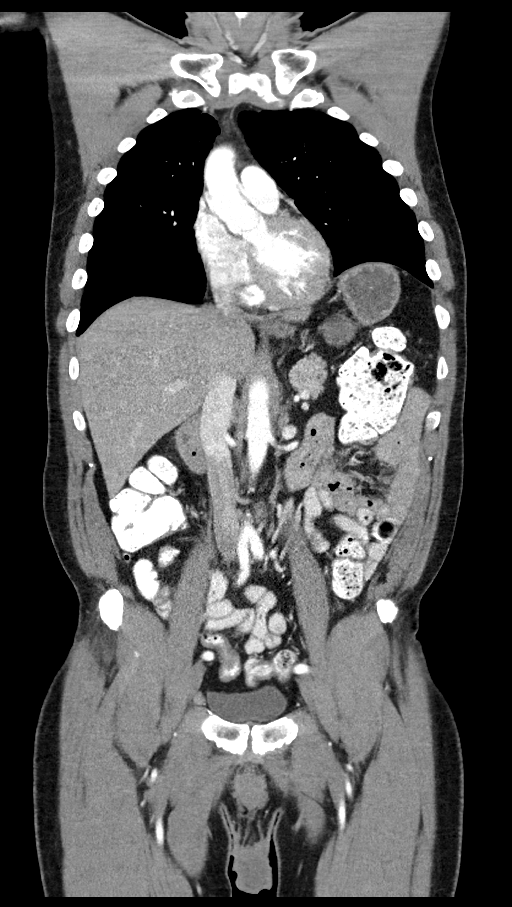

[13 of 36 positions shown; findings below may reference images not displayed]

FINDINGS: CT CHEST FINDINGS

Cardiovascular: Normal caliber thoracic aorta and central pulmonary
arteries. Normal size heart. No significant pericardial
effusion/thickening.

Mediastinum/Nodes: No supraclavicular adenopathy. No discrete
thyroid nodularity. No pathologically enlarged mediastinal or hilar
lymph nodes. Prominent bilateral axillary lymph nodes are again
visualized with the previously indexed left axillary lymph node now
measuring 9 mm in short axis on image [DATE] previously 12 mm, favored
reactive.

Lungs/Pleura: Similar appearance of the scarring with bronchiectasis
in the medial right lung apex on image [DATE], benign. 10 mm partially
calcified granuloma in the right middle lobe on image 65/4,
unchanged and benign. No new suspicious pulmonary nodules or masses.
Upper lung predominant centrilobular and paraseptal emphysematous
change. No focal airspace consolidation. Similar mild eventration
left hemidiaphragm. No pleural effusion. No pneumothorax.

Musculoskeletal: No aggressive lytic or blastic lesion of bone.

CT ABDOMEN PELVIS FINDINGS

Hepatobiliary: Hypodense 4 mm lesion in the right lobe of the liver
on image 63/2, which will not readily evident on prior CT this was
subtly evident in retrospect on CT July 13, 2019. No solid
enhancing hepatic lesion. Gallbladder is unremarkable. No biliary
ductal dilation.

Pancreas: Within normal limits.

Spleen: Within normal limits.

Adrenals/Urinary Tract: Adrenal glands are unremarkable. Kidneys are
normal, without renal calculi, solid enhancing lesion, or
hydronephrosis. Bladder is unremarkable for degree of distension.

Stomach/Bowel: Radiopaque enteric contrast traverses the distal
colon. Postoperative changes of partial left hemicolectomy with
suture line left mid abdomen on image 65/2. No colonic wall
thickening or mass like lesions evident. No evidence of bowel
obstruction. Normal appendix.

Vascular/Lymphatic: Aortic atherosclerosis without aneurysmal
dilation. No pathologically enlarged abdominal or pelvic lymph
nodes.

Reproductive: Prostate is unremarkable.

Other: No abdominopelvic ascites. No discrete peritoneal nodularity.

Musculoskeletal: Multilevel degenerative changes spine. No
aggressive lytic or blastic lesion of bone.
IMPRESSION: 1. Postoperative changes of partial left hemicolectomy.
2. No definite evidence of recurrent or metastatic disease within
the chest, abdomen, or pelvis.
3. Hypodense 4 mm lesion in the right lobe of the liver, which will
not readily evident on prior CT but was subtly evident in retrospect
on CT July 13, 2019, likely representing a hepatic cyst. Attention
on follow-up studies is suggested.
4. Aortic Atherosclerosis ([id number]) and Emphysema ([id number].5).
